# Patient Record
Sex: Female | Born: 1960 | Race: White | Hispanic: No | Marital: Married
Health system: Midwestern US, Community
[De-identification: ages and names within clinical notes are randomized; demographics above are authoritative.]

## PROBLEM LIST (undated history)

## (undated) DIAGNOSIS — E785 Hyperlipidemia, unspecified: Secondary | ICD-10-CM

## (undated) DIAGNOSIS — C801 Malignant (primary) neoplasm, unspecified: Secondary | ICD-10-CM

## (undated) HISTORY — PX: SPINAL FUSION: SHX223

## (undated) HISTORY — PX: HIATAL HERNIA REPAIR: SHX195

## (undated) HISTORY — PX: ABDOMINAL HYSTERECTOMY: SHX81

## (undated) HISTORY — PX: OTHER SURGICAL HISTORY: SHX169

## (undated) HISTORY — DX: Hyperlipidemia, unspecified: E78.5

---

## 2016-01-25 ENCOUNTER — Ambulatory Visit: Admit: 2016-01-25 | Discharge: 2016-01-25 | Payer: PRIVATE HEALTH INSURANCE | Attending: Urology

## 2016-01-25 DIAGNOSIS — N201 Calculus of ureter: Secondary | ICD-10-CM

## 2016-01-25 LAB — AMB POC URINALYSIS DIP STICK AUTO W/ MICRO (MICRO RESULTS)
Bilirubin (UA POC): NEGATIVE
Blood (UA POC): NEGATIVE
Crystals (UA POC): NEGATIVE
Epithelial cells (UA POC): 0
Glucose (UA POC): NEGATIVE
Ketones (UA POC): NEGATIVE
Nitrites (UA POC): NEGATIVE
Protein (UA POC): NEGATIVE
RBCs (UA POC): 0
Specific gravity (UA POC): 1.005 (ref 1.001–1.035)
Urobilinogen (UA POC): 0.2 (ref 0.2–1)
WBCs (UA POC): 0
pH (UA POC): 5.5 (ref 4.6–8.0)

## 2016-01-25 MED ORDER — TAMSULOSIN SR 0.4 MG 24 HR CAP
0.4 mg | ORAL_CAPSULE | Freq: Every evening | ORAL | 0 refills | Status: AC
Start: 2016-01-25 — End: 2016-02-08

## 2016-01-25 NOTE — Progress Notes (Signed)
HISTORY OF PRESENT ILLNESS:  Vanessa Atkinson is a 55 y.o. female who is seen in consultation for ?IC. On Lorazepam 1mg  PO. Pt states that she has been diagnosed w/ IC and given Vanessa Atkinson after nerve blocks were placed in her back. Pt is unsure if she truly has IC because symptoms have subsided since having back surgery. Pt had cysto and denies any pain w/ cysto filling. Pt had frequency Q 2 hours, nocturia x2, and severe chronic dysuria for approx 8 months prior to back surgery. Pt w/ hx of stones. Pt saw GI for moderate, aching abdominal pain and was sent for CT which showed left renal stone and small right ureteral stone. Pt has stopped Vanessa Atkinson and abdominal pain has subsided.    Past Medical History:   Diagnosis Date   ??? IC (interstitial cystitis)        Past Surgical History:   Procedure Laterality Date   ??? HX APPENDECTOMY     ??? HX CERVICAL FUSION     ??? HX CESAREAN SECTION      two   ??? HX HYSTERECTOMY         Social History   Substance Use Topics   ??? Smoking status: Never Smoker   ??? Smokeless tobacco: Never Used   ??? Alcohol use No       Allergies   Allergen Reactions   ??? Myrbetriq [Mirabegron] Anaphylaxis   ??? Penicillins Hives   ??? Percocet [Oxycodone-Acetaminophen] Nausea and Vomiting   ??? Sulfa (Sulfonamide Antibiotics) Hives       Family History   Problem Relation Age of Onset   ??? Prostate Cancer Father        Current Outpatient Prescriptions   Medication Sig Dispense Refill   ??? LORazepam (ATIVAN) 1 mg tablet      ??? simvastatin (ZOCOR) 10 mg tablet Take  by mouth nightly.     ??? tamsulosin (FLOMAX) 0.4 mg capsule Take 1 Cap by mouth nightly for 14 days. 14 Cap 0       Review of Systems  Constitutional: Fever: No  Skin: Rash: No  HEENT: Hearing difficulty: No  Eyes: Blurred vision: No  Cardiovascular: Chest pain: No  Respiratory: Shortness of breath: No  Gastrointestinal: Nausea/vomiting: No  Musculoskeletal: Back pain: No  Neurological: Weakness: No  Psychological: Memory loss: No   Comments/additional findings:       PHYSICAL EXAMINATION:     Visit Vitals   ??? BP 102/81   ??? Pulse 77   ??? Temp 97.5 ??F (36.4 ??C)   ??? Resp 16   ??? Ht 5\' 8"  (1.727 m)   ??? Wt 190 lb (86.2 kg)   ??? BMI 28.89 kg/m2     Constitutional: Well developed, no acute distress.   Eyes:  Conjunctiva normal.  Ears:  External ear normal.  Nose/Throat:  External nose normal.  CV:  Heart rate regular, No peripheral swelling noted.  Respiratory: No respiratory distress, No audible wheeze.  Abdomen:  Soft, nontender, nondistended, no mass/organomegaly.  Vascular:  Abdominal Aorta nonpalpable.  Skin: No rash, No ulcer.    Neuro/Psych:  Patient with appropriate affect.  Alert and oriented x 3.    Gait:  Normal.  GU Female:      CVA: non-tender bilaterally. Bladder not palpable.        REVIEW OF LABS AND IMAGING:      Results for orders placed or performed in visit on 01/25/16   AMB POC URINALYSIS DIP STICK AUTO  W/ MICRO (MICRO RESULTS)   Result Value Ref Range    Color (UA POC) Yellow     Clarity (UA POC) Clear     Glucose (UA POC) Negative Negative    Bilirubin (UA POC) Negative Negative    Ketones (UA POC) Negative Negative    Specific gravity (UA POC) 1.005 1.001 - 1.035    Blood (UA POC) Negative Negative    pH (UA POC) 5.5 4.6 - 8.0    Protein (UA POC) Negative Negative    Urobilinogen (UA POC) 0.2 mg/dL 0.2 - 1    Nitrites (UA POC) Negative Negative    Leukocyte esterase (UA POC) Trace Negative    Epithelial cells (UA POC) 0     WBCs (UA POC) 0      RBCs (UA POC) 0      Bacteria (UA POC) None Negative    Crystals (UA POC) Negative Negative    Other (UA POC)         ASSESSMENT:     ICD-10-CM ICD-9-CM    1. Ureteral calculus N20.1 592.1 AMB POC URINALYSIS DIP STICK AUTO W/ MICRO (MICRO RESULTS)   2. Kidney stone N20.0 592.0    3. Personal history of kidney stones Z87.442 V13.01          PLAN:    ?? Rx Flomax for 2 wks in case stone still present in ureter as cause of abd pain   ?? Increase water intake in case stone still present in ureter  ?? F/u 2 wks- if symptoms still present may need repeat ct  ?? D/c Vanessa Atkinson as doubt IC     Patient's BMI is out of the normal parameters.  Information about BMI was given to the patient.    Chief Complaint   Patient presents with   ??? Interstitial Cystitis     ?IC     Medical documentation provided with the assistance of Isac SarnaKimber N Moore, medical scribe for [employer] LifeChristi Jaylynne Birkhead, DO, [address]FACOS     Georganna Maxson [place], [place]

## 2016-01-30 NOTE — Progress Notes (Signed)
Likely contaminated as voided sample and skin flora.

## 2016-02-08 ENCOUNTER — Ambulatory Visit: Admit: 2016-02-08 | Discharge: 2016-02-08 | Payer: PRIVATE HEALTH INSURANCE | Attending: Urology

## 2016-02-08 DIAGNOSIS — N760 Acute vaginitis: Secondary | ICD-10-CM

## 2016-02-08 LAB — AMB POC URINALYSIS DIP STICK AUTO W/ MICRO (MICRO RESULTS)
Bilirubin (UA POC): NEGATIVE
Blood (UA POC): NEGATIVE
Crystals (UA POC): NEGATIVE
Epithelial cells (UA POC): 0
Glucose (UA POC): NEGATIVE
Ketones (UA POC): NEGATIVE
Leukocyte esterase (UA POC): NEGATIVE
Nitrites (UA POC): NEGATIVE
Protein (UA POC): NEGATIVE
RBCs (UA POC): 0
Specific gravity (UA POC): 1.005 (ref 1.001–1.035)
Urobilinogen (UA POC): 0.2 (ref 0.2–1)
WBCs (UA POC): 0
pH (UA POC): 5.5 (ref 4.6–8.0)

## 2016-02-08 NOTE — Progress Notes (Signed)
HISTORY OF PRESENT ILLNESS:  Vanessa Atkinson is a 55 y.o. female who presents today for f/u pt w/ c/o frequency, nocturia, dysuria, prior to a back surgery. Was diagnosed by [facility] w/ IC, but pt says all symptoms gone after back surgery. Had prior CT showing left renal and right ureteral stone and was having abdominal and flank pain. Last visit given Flomax and told to increase water intake.       Pt denies flank or abdominal pain, but notes moderate burning on external vulva. Pt reports that Flomax made her unable to hold her urine so stopped taking.    AUA Symptom Score 01/25/2016   Over the past month how often have you had the sensation that your bladder was not completely empty after you finished urinating? 1   Over the past month, how often have had to urinate again less than 2 hours after you last finished urinating? 0   Over the past month, how often have you found you stopped and started again several times when you urinated? 0   Over the past month, how often have you found it difficult to postpone urination? 1   Over the past month, how often have you had a weak urinary stream? 0   Over the past month, how often have you had to push or strain to begin urinating? 0   Over the past month, how many times did you most typically get up to urinate from the time you went to bed at night until the time you got up in the morning? 1   AUA Score 3   If you were to spend the rest of your life with your urinary condition the way it is now, how would you feel about that? Unhappy       Past Medical History:   Diagnosis Date   ??? IC (interstitial cystitis)        Past Surgical History:   Procedure Laterality Date   ??? HX APPENDECTOMY     ??? HX CERVICAL FUSION     ??? HX CESAREAN SECTION      two   ??? HX HYSTERECTOMY         Social History   Substance Use Topics   ??? Smoking status: Never Smoker   ??? Smokeless tobacco: Never Used   ??? Alcohol use No       Allergies   Allergen Reactions    ??? Myrbetriq [Mirabegron] Anaphylaxis   ??? Penicillins Hives   ??? Percocet [Oxycodone-Acetaminophen] Nausea and Vomiting   ??? Sulfa (Sulfonamide Antibiotics) Hives       Family History   Problem Relation Age of Onset   ??? Prostate Cancer Father        Current Outpatient Prescriptions   Medication Sig Dispense Refill   ??? LORazepam (ATIVAN) 1 mg tablet      ??? simvastatin (ZOCOR) 10 mg tablet Take  by mouth nightly.         REVIEW OF SYSTEMS:  Constitutional: Fever: No  Skin: Rash: No  HEENT: Hearing difficulty: No  Eyes: Blurred vision: No  Cardiovascular: Chest pain: No  Respiratory: Shortness of breath: No  Gastrointestinal: Nausea/vomiting: No  Musculoskeletal: Back pain: No  Neurological: Weakness: No  Psychological: Memory loss: No  Comments/additional findings:       PHYSICAL EXAMINATION:   Visit Vitals   ??? BP 107/74   ??? Pulse 74   ??? Temp 97.8 ??F (36.6 ??C)   ??? Resp 16   ???  Ht 5\' 8"  (1.727 m)   ??? Wt 190 lb (86.2 kg)   ??? BMI 28.89 kg/m2     Constitutional: Well developed, no acute distress.   Eyes:  Conjunctiva normal.  Ears:  External ear normal.  Nose/Throat:  External nose normal.  CV:  Heart rate regular, No peripheral swelling noted.  Respiratory: No respiratory distress, No audible wheeze.  Skin: No rash, No ulcer.    Neuro/Psych:  Patient with appropriate affect.  Alert and oriented x 3.    Gait:  Normal.  GU Female:        normal external female genitalia   normal urethral meatus with no lesions or prolapse   normal urethra with no mass   normal bladder with no fullness or tenderness   normal vagina with no mucosal lesions or prolapse   no vaginal discharge   Mild vaginal atrophy   Did Q-tip test and pt had discreet tenderness of vestibule      REVIEW OF LABS AND IMAGING:      Results for orders placed or performed in visit on 02/08/16   AMB POC URINALYSIS DIP STICK AUTO W/ MICRO (MICRO RESULTS)   Result Value Ref Range    Color (UA POC) Yellow     Clarity (UA POC) Clear     Glucose (UA POC) Negative Negative     Bilirubin (UA POC) Negative Negative    Ketones (UA POC) Negative Negative    Specific gravity (UA POC) 1.005 1.001 - 1.035    Blood (UA POC) Negative Negative    pH (UA POC) 5.5 4.6 - 8.0    Protein (UA POC) Negative Negative    Urobilinogen (UA POC) 0.2 mg/dL 0.2 - 1    Nitrites (UA POC) Negative Negative    Leukocyte esterase (UA POC) Negative Negative    Epithelial cells (UA POC) 0     WBCs (UA POC) 0      RBCs (UA POC) 0      Bacteria (UA POC) None Negative    Crystals (UA POC) Negative Negative    Other (UA POC)         ASSESSMENT:     ICD-10-CM ICD-9-CM    1. Vestibulitis of vagina N76.2 616.10    2. Vaginal atrophy N95.2 627.3    3. Personal history of kidney stones Z87.442 V13.01    4. Interstitial cystitis N30.10 595.1 AMB POC URINALYSIS DIP STICK AUTO W/ MICRO (MICRO RESULTS)         PLAN:    ?? Rx estrogen and testosterone cream to apply BID to vestibule  ?? F/u in approx 1 month     Patient's BMI is out of the normal parameters.  Information about BMI was given and patient was advised to follow-up with their PCP for further management.    Chief Complaint   Patient presents with   ??? Interstitial Cystitis     f/u ?IC     Medical documentation provided with the assistance of Isac SarnaKimber N Moore, medical scribe for [employer]Christi Ishaan Villamar, DO, [place].    Arush Gatliff [place], DO

## 2016-03-16 ENCOUNTER — Encounter: Attending: Urology

## 2016-03-26 ENCOUNTER — Encounter: Attending: Urology

## 2019-09-06 LAB — EXTERNAL GENERIC LAB PROCEDURE: COLOGUARD: NEGATIVE

## 2020-05-02 LAB — LIPID PANEL
Cholesterol: 214 — AB (ref 0–200)
LDL Cholesterol: 125

## 2020-05-02 LAB — COMPREHENSIVE METABOLIC PANEL
Calcium: 10.5 (ref 8.7–10.7)
GFR calc non Af Amer: 70

## 2020-05-02 LAB — BASIC METABOLIC PANEL
BUN: 13 (ref 4–21)
Creatinine: 0.8 (ref 0.5–1.1)

## 2020-05-04 LAB — BASIC METABOLIC PANEL
BUN: 21 (ref 4–21)
Creatinine: 0.7 (ref 0.5–1.1)

## 2020-05-04 LAB — COMPREHENSIVE METABOLIC PANEL
Calcium: 11.1 — AB (ref 8.7–10.7)
GFR calc non Af Amer: 83

## 2020-06-01 ENCOUNTER — Other Ambulatory Visit: Payer: Self-pay

## 2020-06-01 ENCOUNTER — Ambulatory Visit: Payer: BC Managed Care – PPO | Admitting: Nurse Practitioner

## 2020-06-01 ENCOUNTER — Encounter: Payer: Self-pay | Admitting: Nurse Practitioner

## 2020-06-01 NOTE — Progress Notes (Signed)
Endocrinology Consult Note        06/01/2020, 10:01 AM  Loretta Young is a 60 y.o.-year-old female, referred by her  Theone Murdoch, FNP  , for evaluation for hypercalcemia/hyperparathyroidism.   Past Medical History:  Diagnosis Date  . Hyperlipidemia     Past Surgical History:  Procedure Laterality Date  . ABDOMINAL HYSTERECTOMY    . CESAREAN SECTION    . HIATAL HERNIA REPAIR    . l4-l5 fusion      Social History   Tobacco Use  . Smoking status: Never Smoker  . Smokeless tobacco: Never Used    History reviewed. No pertinent family history.  Outpatient Encounter Medications as of 06/01/2020  Medication Sig  . Cholecalciferol 50 MCG (2000 UT) TABS Take by mouth.  . ezetimibe (ZETIA) 10 MG tablet Take 10 mg by mouth daily.  . Multiple Vitamin (MULTI-VITAMIN) tablet Take 1 tablet by mouth daily.  . NON FORMULARY Cs-e2 0.2/te 1 Nat Whip vaginal cream   No facility-administered encounter medications on file as of 06/01/2020.    Allergies  Allergen Reactions  . Penicillins   . Percocet [Oxycodone-Acetaminophen]   . Sulfa Antibiotics      HPI  Loretta Young was diagnosed with hypercalcemia in February, 2022.  Patient has no previously known history of parathyroid, pituitary, adrenal dysfunctions; + family history of hyperparathyroidism in her mother. -Review of her referral package of most recent labs reveals calcium of 11.1 the corresponding PTH of 142 on 05/20/20.  We discussed pt's DEXA scans: no report available to review but patient reports she is osteopenic, borderline osteoporotic.   No prior history of fragility fractures or falls. + history of kidney stones.  No history of CKD. Last BUN/Cr: 21/0.7  she is not on HCTZ or other thiazide therapy.  + history of vitamin D deficiency. Last vitamin D level was 38 in 05/02/20 (on replacement therapy).  she is on calcium supplements (recently started by her PCP after  her Dexa scan results showed osteopenia as she did not want to start Prolia at that time). she eats dairy and green, leafy, vegetables on average amounts.  She does not eat meat, therefore the majority of her meals consist of various salads.  she does not haveve a family history of pituitary tumors, thyroid cancer, or osteoporosis.   I reviewed her chart and she also has a history of HLD.    Review of systems  Constitutional: + steadily increasing body weight,  current Body mass index is 32.39 kg/m. , + fatigue, no subjective hyperthermia, no subjective hypothermia Eyes: no blurry vision, no xerophthalmia ENT: no sore throat, no nodules palpated in throat, no dysphagia/odynophagia, no hoarseness Cardiovascular: no chest pain, no shortness of breath, no palpitations, no leg swelling Respiratory: no cough, no shortness of breath Gastrointestinal: no nausea/vomiting/diarrhea Musculoskeletal: no muscle/joint aches Skin: no rashes, no hyperemia Neurological: no tremors, no numbness, no tingling, no dizziness Psychiatric: no depression, no anxiety  ----------------------------------------------------------------------------------------------------------------------------- OBJECTIVE:  BP 130/88 (BP Location: Left Arm, Patient Position: Sitting)   Pulse 69   Ht 5\' 8"  (1.727 m)   Wt 213 lb (96.6 kg)   BMI 32.39 kg/m , Body mass index is 32.39 kg/m.  Wt Readings from Last 3 Encounters:  06/01/20 213 lb (96.6 kg)    BP Readings from Last 3 Encounters:  06/01/20 130/88    Physical Exam- Limited  Constitutional:  Body mass index is 32.39 kg/m. , not in acute distress, normal state of mind Eyes:  EOMI, no exophthalmos Neck: Supple Cardiovascular: RRR, no murmers, rubs, or gallops, no edema Respiratory: Adequate breathing efforts, no crackles, rales, rhonchi, or wheezing Musculoskeletal: no gross deformities, strength intact in all four extremities, no gross restriction of joint  movements Skin:  no rashes, no hyperemia Neurological: no tremor with outstretched hands   CMP ( most recent) CMP     Component Value Date/Time   BUN 21 05/04/2020 0000   CREATININE 0.7 05/04/2020 0000   CALCIUM 11.1 (A) 05/04/2020 0000   GFRNONAA 83 05/04/2020 0000     Diabetic Labs (most recent): No results found for: HGBA1C   Lipid Panel ( most recent) Lipid Panel     Component Value Date/Time   CHOL 214 (A) 05/02/2020 0000   LDLCALC 125 05/02/2020 0000      No results found for: TSH, FREET4   ------------------------------------------------------------------------------------------------------------------------------ Assessment / PLAN:  1. 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 A corresponding intact PTH level was also high, at 142.  - Patient also  has vitamin D deficiency, with the last level being 38 (on Vitamin D supplementation).  - She does have apparent complications from hypercalcemia/hyperparathyroidism with nephrolithiasis and osteopenia.    -She is advised to stop all Calcium supplements.  Will need to revisit choice of osteopenia treatment once work complete for hyperparathyroidism as once it is corrected, bone loss will not continue and may not require further treatment.  - I discussed with the patient about the physiology of calcium and parathyroid hormone, and possible effects of increased PTH/ Calcium, including kidney stones, cardiac dysrhythmias, osteoporosis, abdominal pain, etc.   - The work up so far is not sufficient to reach a conclusion for definitive therapy.  she needs more studies to confirm and classify the parathyroid dysfunction she may have. -It is essential to obtain 24-hour urine calcium/creatinine to rule out the rare but important cause of mild elevation in calcium and PTH- [place] ( Familial Hypocalciuric  Hypercalcemia), which may not require any active intervention.  - I will request for her next DEXA scan to include the distal 33% of radius for evaluation of cortical bone, which is predominantly affected by hyperparathyroidism.  I have requested the patient obtain a copy of her recent bone density scan to be sent to our office.    - Time spent with the patient: 60 minutes, of which >50% was spent in obtaining information about her symptoms, reviewing her previous labs, evaluations, and treatments, counseling her about her  hypercalcemia , and developing a plan to confirm the diagnosis and long term treatment as necessary.  Please refer to "Patient Self Inventory" in the Media  tab for reviewed elements of pertinent patient history.  Lexine Baton participated in the discussions, expressed understanding, and voiced agreement with the above plans.  All questions were answered to her satisfaction. she is encouraged to contact clinic should she have any questions or concerns prior to her return visit.   Follow Up PLAN: - Return in about 2 weeks (around 06/15/2020) for hypoparathyroid- 24-hr urine.   Rayetta Pigg, FNP-BC [facility] [address] [place], [place]  [place] Phone: [phone] Fax: [phone]    06/01/2020, 10:00 AM

## 2020-06-01 NOTE — Patient Instructions (Signed)
Hypercalcemia Hypercalcemia is when the level of calcium in a person's blood is above normal. The body needs calcium to make bones and keep them strong. Calcium also helps the muscles, nerves, brain, and heart work the way they should. Most of the calcium in the body is in the bones. There is also some calcium in the blood. Hypercalcemia can happen when calcium comes out of the bones, or when the kidneys are not able to remove calcium from the blood. Hypercalcemia can be mild or severe. What are the causes? There are many possible causes of hypercalcemia. Common causes of this condition include:  Hyperparathyroidism. This is a condition in which the body produces too much parathyroid hormone. There are four parathyroid glands in your neck. These glands produce a chemical messenger (hormone) that helps the body absorb calcium from foods and helps your bones release calcium.  Certain kinds of cancer. Less common causes of hypercalcemia include:  Getting too much calcium or vitamin D from your diet.  Kidney failure.  Hyperthyroidism.  Severe dehydration.  Being on bed rest or being inactive for a long time.  Certain medicines.  Infections. What increases the risk? You are more likely to develop this condition if you:  Are female.  Are 60 years of age or older.  Have a family history of hypercalcemia. What are the signs or symptoms? Mild hypercalcemia that starts slowly may not cause symptoms. Severe, sudden hypercalcemia is more likely to cause symptoms, such as:  Being more thirsty than usual.  Needing to urinate more often than usual.  Abdominal pain.  Nausea and vomiting.  Constipation.  Muscle pain, twitching, or weakness.  Feeling very tired. How is this diagnosed? Hypercalcemia is usually diagnosed with a blood test. You may also have tests to help determine what is causing this condition, such as imaging tests and more blood tests.   How is this  treated? Treatment for hypercalcemia depends on the cause. Treatment may include:  Receiving fluids through an IV.  Medicines that: ? Keep calcium levels steady after receiving fluids (loop diuretics). ? Keep calcium in your bones (bisphosphonates). ? Lower the calcium level in your blood.  Surgery to remove overactive parathyroid glands.  A procedure that filters your blood to correct calcium levels (hemodialysis). Follow these instructions at home:  Take over-the-counter and prescription medicines only as told by your health care provider.  Follow instructions from your health care provider about eating or drinking restrictions.  Drink enough fluid to keep your urine pale yellow.  Stay active. Weight-bearing exercise helps to keep calcium in your bones. Follow instructions from your health care provider about what type and level of exercise is safe for you.  Keep all follow-up visits as told by your health care provider. This is important.   Contact a health care provider if you have:  A fever.  A heartbeat that is irregular or very fast.  Changes in mood, memory, or personality. Get help right away if you:  Have severe abdominal pain.  Have chest pain.  Have trouble breathing.  Become very confused and sleepy.  Lose consciousness. Summary  Hypercalcemia is when the level of calcium in a person's blood is above normal. The body needs calcium to make bones and keep them strong. Calcium also helps the muscles, nerves, brain, and heart work the way they should.  There are many possible causes of hypercalcemia, and treatment depends on the cause.  Take over-the-counter and prescription medicines only as told by your   health care provider.  Follow instructions from your health care provider about eating or drinking restrictions. This information is not intended to replace advice given to you by your health care provider. Make sure you discuss any questions you have with  your health care provider. Document Revised: 03/25/2018 Document Reviewed: 12/02/2017 Elsevier Patient Education  2021 [employer]  

## 2020-06-10 LAB — CREATININE, URINE, 24 HOUR
Creatinine, 24H Ur: 814 mg/24 hr (ref 800–1800)
Creatinine, Urine: 40.7 mg/dL

## 2020-06-10 LAB — CALCIUM, URINE, 24 HOUR
Calcium, 24H Urine: 226 mg/24 hr (ref 0–320)
Calcium, Urine: 11.3 mg/dL

## 2020-06-13 ENCOUNTER — Telehealth: Payer: Self-pay

## 2020-06-13 ENCOUNTER — Telehealth: Payer: BC Managed Care – PPO | Admitting: Nurse Practitioner

## 2020-06-14 ENCOUNTER — Other Ambulatory Visit: Payer: Self-pay

## 2020-06-14 ENCOUNTER — Telehealth (INDEPENDENT_AMBULATORY_CARE_PROVIDER_SITE_OTHER): Payer: BC Managed Care – PPO | Admitting: Nurse Practitioner

## 2020-06-14 ENCOUNTER — Encounter: Payer: Self-pay | Admitting: Nurse Practitioner

## 2020-06-14 DIAGNOSIS — E210 Primary hyperparathyroidism: Secondary | ICD-10-CM

## 2020-06-14 NOTE — Progress Notes (Signed)
Endocrinology Follow Up Note        06/14/2020, 8:31 AM    TELEHEALTH VISIT: The patient is being engaged in telehealth visit due to COVID-19.  This type of visit limits physical examination significantly, and thus is not preferable over face-to-face encounters.  I connected with  Loretta Young on 06/14/20 by a video enabled telemedicine application and verified that I am speaking with the correct person using two identifiers.   I discussed the limitations of evaluation and management by telemedicine. The patient expressed understanding and agreed to proceed.    The participants involved in this visit include: Loretta Romp, NP located at [facility] and Loretta Young  located at their personal residence listed.    Loretta Young is a 60 y.o.-year-old female, referred by her  Loretta Murdoch, FNP  , for evaluation for hypercalcemia/hyperparathyroidism.   Past Medical History:  Diagnosis Date  . Hyperlipidemia     Past Surgical History:  Procedure Laterality Date  . ABDOMINAL HYSTERECTOMY    . CESAREAN SECTION    . HIATAL HERNIA REPAIR    . l4-l5 fusion      Social History   Tobacco Use  . Smoking status: Never Smoker  . Smokeless tobacco: Never Used    History reviewed. No pertinent family history.  Outpatient Encounter Medications as of 06/14/2020  Medication Sig  . Cholecalciferol 50 MCG (2000 UT) TABS Take by mouth.  . ezetimibe (ZETIA) 10 MG tablet Take 10 mg by mouth daily.  . Multiple Vitamin (MULTI-VITAMIN) tablet Take 1 tablet by mouth daily.  . NON FORMULARY Cs-e2 0.2/te 1 Nat Whip vaginal cream   No facility-administered encounter medications on file as of 06/14/2020.    Allergies  Allergen Reactions  . Penicillins   . Percocet [Oxycodone-Acetaminophen]   . Sulfa Antibiotics      HPI  Loretta Young was diagnosed with hypercalcemia in February, 2022.  Patient has no previously  known history of parathyroid, pituitary, adrenal dysfunctions; + family history of hyperparathyroidism in her mother. -Review of her referral package of most recent labs reveals calcium of 11.1 the corresponding PTH of 142 on 05/20/20.  We discussed pt's DEXA scans: no report available to review but patient reports she is osteopenic, borderline osteoporotic.  Report from [facility] center shows T-score of -2.5 indicating osteoporosis.  No prior history of fragility fractures or falls. + history of kidney stones.  No history of CKD. Last BUN/Cr: 21/0.7  she is not on HCTZ or other thiazide therapy.  + history of vitamin D deficiency. Last vitamin D level was 38 in 05/02/20 (on replacement therapy).  she is not currently on calcium supplements (had recently been started on supplement by her PCP after her Dexa scan results showed osteopenia as she did not want to start Prolia at that time, however she was told to stop the supplementation at our initial visit). she eats dairy and green, leafy, vegetables on average amounts.  She does not eat meat, therefore the majority of her meals consist of various salads.  she does not haveve a family history of pituitary tumors, thyroid cancer, or osteoporosis.   I reviewed her chart and she also has  a history of HLD.    Review of systems  Constitutional: + steadily increasing body weight,  current There is no height or weight on file to calculate BMI. , + fatigue, no subjective hyperthermia, no subjective hypothermia Eyes: no blurry vision, no xerophthalmia ENT: no sore throat, no nodules palpated in throat, no dysphagia/odynophagia, no hoarseness Cardiovascular: no chest pain, no shortness of breath, no palpitations, no leg swelling Respiratory: no cough, no shortness of breath Gastrointestinal: no nausea/vomiting/diarrhea Musculoskeletal: no muscle/joint aches Skin: no rashes, no hyperemia Neurological: no tremors, no numbness, no tingling, no  dizziness Psychiatric: no depression, no anxiety, complains of difficulty concentrating  ----------------------------------------------------------------------------------------------------------------------------- OBJECTIVE:  There were no vitals taken for this visit., There is no height or weight on file to calculate BMI. Wt Readings from Last 3 Encounters:  06/01/20 213 lb (96.6 kg)    BP Readings from Last 3 Encounters:  06/01/20 130/88    Physical Exam- Telehealth- significantly limited due to nature of visit  Constitutional: There is no height or weight on file to calculate BMI. , not in acute distress, normal state of mind Respiratory: Adequate breathing efforts   CMP ( most recent) CMP     Component Value Date/Time   BUN 21 05/04/2020 0000   CREATININE 0.7 05/04/2020 0000   CALCIUM 11.1 (A) 05/04/2020 0000   GFRNONAA 83 05/04/2020 0000     Diabetic Labs (most recent): No results found for: HGBA1C   Lipid Panel ( most recent) Lipid Panel     Component Value Date/Time   CHOL 214 (A) 05/02/2020 0000   LDLCALC 125 05/02/2020 0000      No results found for: TSH, FREET4   ------------------------------------------------------------------------------------------------------------------------------ Assessment / PLAN:  1. Hypercalcemia / Hyperparathyroidism-  D/t primary hyperparathyroidism  Patient has had several instances of elevated calcium, with the highest level being at 11.1 mg/dL. A corresponding intact PTH level was also high, at 142.  - Patient also  has vitamin D deficiency, with the last level being 38 (on Vitamin D supplementation).  - She does have apparent complications from hypercalcemia/hyperparathyroidism with nephrolithiasis and osteopenia.    -She is advised to continue avoiding all Calcium supplements.  Will need to revisit choice of osteopenia treatment once work complete for hyperparathyroidism as once it is corrected, bone loss will not  continue and may not require further treatment.  - I discussed with the patient about the physiology of calcium and parathyroid hormone, and possible effects of increased PTH/ Calcium, including kidney stones, cardiac dysrhythmias, osteoporosis, abdominal pain, etc.   -Her 24-hr urine studies ruled out [place], thus confirming suspicion of primary hyperparathyroidism.  -She will be referred to Dr. Armandina Gemma with [facility] for parathyroidectomy of the overactive gland.  After which, she will return here in about 1 week postop to recheck PTH and calcium levels and also check TSH and Free T4 levels as well.     I spent 20 minutes dedicated to the care of this patient on the date of this encounter to include pre-visit review of records, face-to-face time with the patient, and post visit ordering of  testing.   Loretta Young participated in the discussions, expressed understanding, and voiced agreement with the above plans.  All questions were answered to her satisfaction. she is encouraged to contact clinic should she have any questions or concerns prior to her return visit.   Follow Up PLAN: - Return if symptoms worsen or fail to improve- after surgery to follow up with labs,  for Thyroid follow up, Previsit labs.   Rayetta Pigg, [facility] [address] [place], [place] [address] Phone: [phone] Fax: [phone]    06/14/2020, 8:31 AM

## 2020-06-14 NOTE — Patient Instructions (Signed)
Parathyroidectomy  A parathyroidectomy is a surgery to remove one or more parathyroid glands. These glands are in the neck. Each gland is very small, about the size of a pea. Most people have four parathyroid glands. The glands produce parathyroid hormone, which helps to control the level of calcium in the body. You may have a parathyroidectomy if your body produces too much parathyroid hormone (hyperparathyroidism). This usually occurs when one or more of your parathyroid glands becomes enlarged from a type of noncancerous tumor (adenoma). Tell a health care provider about:  Any allergies you have.  All medicines you are taking, including vitamins, herbs, eye drops, creams, and over-the-counter medicines.  Any problems you or family members have had with anesthetic medicines.  Any blood disorders you have.  Any surgeries you have had.  Any medical conditions you have.  Whether you are pregnant or may be pregnant. What are the risks? Generally, this is a safe procedure. However, problems may occur, including:  Bleeding.  Infection.  Allergic reactions to medicines.  Damage to the nerves of your voice box (larynx). This can be temporary or long-term (rare).  Damage to nearby structures and organs, such as the skin (scarring), surrounding blood vessels, and nerves in the neck.  Hoarseness. This usually resolves in 24-48 hours.  A condition in which your body does not make enough parathyroid hormone (hypoparathyroidism). This is rare.  Difficulty breathing. This is rare. What happens before the procedure? Staying hydrated Follow instructions from your health care provider about hydration, which may include:  Up to 2 hours before the procedure - you may continue to drink clear liquids, such as water, clear fruit juice, black coffee, and plain tea. Eating and drinking restrictions Follow instructions from your health care provider about eating and drinking, which may  include:  8 hours before the procedure - stop eating heavy meals or foods such as meat, fried foods, or fatty foods.  6 hours before the procedure - stop eating light meals or foods, such as toast or cereal.  6 hours before the procedure - stop drinking milk or drinks that contain milk.  2 hours before the procedure - stop drinking clear liquids. Medicines Ask your health care provider about:  Changing or stopping your regular medicines. This is especially important if you are taking diabetes medicines or blood thinners.  Taking medicines such as aspirin and ibuprofen. These medicines can thin your blood. Do not take these medicines unless your health care provider tells you to take them.  Taking over-the-counter medicines, vitamins, herbs, and supplements. General instructions  You may be asked to shower with a germ-killing soap.  Plan to have a responsible adult take you home from the hospital or clinic.  Plan to have a responsible adult care for you for at least 24 hours after you leave the hospital or clinic. This is important. What happens during the procedure?  To lower your risk of infection: ? Your health care team will wash or sanitize their hands. ? Hair may be removed from the surgical area. ? Your skin will be washed with soap.  An IV will be inserted into one of your veins.  You will be given one or more of the following: ? A medicine to help you relax (sedative). ? A medicine to make you fall asleep (general anesthetic).  An incision will be made according to the type of parathyroidectomy procedure you are having. There are four methods that may be used: ? Open surgery. A single  incision will be made in the center of your neck. The incision will be about 2-4 inches long. ? Minimally invasive surgery. A small incision will be made in the side of your neck. This incision will be about 1-2 inches long. Before the procedure, you might be given an injection of a type  of medicine that will help the surgeon to locate the gland. ? Video-assisted surgery. Two small incisions will be made in your neck. One incision is for the instruments that will be used to remove the gland. The other incision is for a tiny camera that will help the surgeon to see inside your neck. ? Endoscopic surgery. An incision will be made just above your collarbone. A small, flexible tube (endoscope) will be inserted through this incision.  Your health care provider may monitor laryngeal nerve function during the procedure for safety reasons.  The gland or glands that are causing problems will be removed.  The incisions will be closed using stitches (sutures) or other methods. The sutures will often be hidden under the skin. The procedure may vary among health care providers and hospitals. What happens after the procedure?  Your blood pressure, heart rate, breathing rate, and blood oxygen level will be monitored until the medicines you were given have worn off.  You will be given pain medicine as needed.  Your provider will check your ability to talk and swallow after the procedure.  You will gradually start to drink liquids and have soft foods as tolerated.  Your blood will be tested to check the calcium level in your body.  Do not drive for 24 hours if you were given a sedative during your procedure. Summary  The parathyroid glands are located in the neck and produce parathyroid hormone, which helps to control the level of calcium in the body.  A parathyroidectomy is a surgery to remove one or more parathyroid glands.  You may have a parathyroidectomy if your body produces too much parathyroid hormone (hyperparathyroidism).  There are four surgical methods that may be used for a parathyroidectomy: open, minimally invasive, video-assisted, and endoscopic.  Generally, this is a safe procedure. However, problems may occur, including bleeding, infection, and a hoarse or weak  voice. This information is not intended to replace advice given to you by your health care provider. Make sure you discuss any questions you have with your health care provider. Document Revised: 11/12/2019 Document Reviewed: 11/12/2019 Elsevier Patient Education  2021 [employer].

## 2020-06-16 ENCOUNTER — Telehealth: Payer: BC Managed Care – PPO | Admitting: Nurse Practitioner

## 2020-06-22 ENCOUNTER — Telehealth: Payer: Self-pay | Admitting: Nurse Practitioner

## 2020-06-22 MED ORDER — FUROSEMIDE 20 MG PO TABS: 20.0000 mg | ORAL_TABLET | Freq: Every day | ORAL | 0 refills | Status: DC | PRN

## 2020-06-22 NOTE — Telephone Encounter (Signed)
Discussed with pt, understanding voiced. 

## 2020-06-22 NOTE — Telephone Encounter (Signed)
Pt is calling back. Please advise thank you

## 2020-06-22 NOTE — Telephone Encounter (Signed)
Pt is calling and states her thyroid surgery is not until May 10th but she is retaining a lot of fluid and wants to know if something can be done for this in the meantime.

## 2020-06-22 NOTE — Telephone Encounter (Signed)
Please advise 

## 2020-07-20 NOTE — Telephone Encounter (Signed)
error 

## 2020-07-22 ENCOUNTER — Other Ambulatory Visit: Payer: Self-pay | Admitting: Surgery

## 2020-07-22 ENCOUNTER — Other Ambulatory Visit (HOSPITAL_COMMUNITY): Payer: Self-pay | Admitting: Surgery

## 2020-07-22 DIAGNOSIS — E213 Hyperparathyroidism, unspecified: Secondary | ICD-10-CM

## 2020-08-03 ENCOUNTER — Encounter (HOSPITAL_COMMUNITY)
Admission: RE | Admit: 2020-08-03 | Discharge: 2020-08-03 | Disposition: A | Discharge status: Home / Self Care | Payer: BC Managed Care – PPO | Source: Ambulatory Visit | Attending: Surgery | Admitting: Surgery

## 2020-08-03 ENCOUNTER — Ambulatory Visit (HOSPITAL_COMMUNITY)
Admission: RE | Admit: 2020-08-03 | Discharge: 2020-08-03 | Disposition: A | Discharge status: Home / Self Care | Payer: BC Managed Care – PPO | Source: Ambulatory Visit | Attending: Surgery | Admitting: Surgery

## 2020-08-03 DIAGNOSIS — E213 Hyperparathyroidism, unspecified: Secondary | ICD-10-CM | POA: Insufficient documentation

## 2020-08-03 MED ORDER — TECHNETIUM TC 99M SESTAMIBI GENERIC - CARDIOLITE
25.0000 | Freq: Once | INTRAVENOUS | Status: AC | PRN
  Administered 2020-08-03: 27 via INTRAVENOUS

## 2020-08-17 ENCOUNTER — Ambulatory Visit: Payer: Self-pay | Admitting: Surgery

## 2020-08-17 ENCOUNTER — Telehealth: Payer: Self-pay | Admitting: Nurse Practitioner

## 2020-08-17 NOTE — Telephone Encounter (Signed)
Pt is calling and states that she seen [place] in March, and was referred to a surgeon but she has yet to hear anything from the surgeon office and she is wondering if we can call and move the process along faster/ [phone]

## 2020-08-17 NOTE — Telephone Encounter (Signed)
Talked with Maudie Mercury for [facility], she stated a surgical order had been submitted this morning and pt should here from surgical scheduler within a few days. Called pt to make her aware, she stated she had just been contacted by Dr.Gerkin.

## 2020-09-01 NOTE — Patient Instructions (Signed)
DUE TO COVID-19 ONLY ONE VISITOR IS ALLOWED TO COME WITH YOU AND STAY IN THE WAITING ROOM ONLY DURING PRE OP AND PROCEDURE DAY OF SURGERY. THE 1 VISITOR  MAY VISIT WITH YOU AFTER SURGERY IN YOUR PRIVATE ROOM DURING VISITING HOURS ONLY!               Loretta Young   Your procedure is scheduled on: 09/05/20   Report to [facility] Main  Entrance   Report to admitting at : 10:00 AM     Call this number if you have problems the morning of surgery [phone]    Remember: Do not eat solid food :After Midnight. Clear liquids until: 9:00 am.  CLEAR LIQUID DIET  Foods Allowed                                                                     Foods Excluded  Coffee and tea, regular and decaf                             liquids that you cannot  Plain Jell-O any favor except red or purple                                           see through such as: Fruit ices (not with fruit pulp)                                     milk, soups, orange juice  Iced Popsicles                                    All solid food Carbonated beverages, regular and diet                                    Cranberry, grape and apple juices Sports drinks like Gatorade Lightly seasoned clear broth or consume(fat free) Sugar, honey syrup  Sample Menu Breakfast                                Lunch                                     Supper Cranberry juice                    Beef broth                            Chicken broth Jell-O                                     Grape  juice                           Apple juice Coffee or tea                        Jell-O                                      Popsicle                                                Coffee or tea                        Coffee or tea  _____________________________________________________________________   BRUSH YOUR TEETH MORNING OF SURGERY AND RINSE YOUR MOUTH OUT, NO CHEWING GUM CANDY OR MINTS.    Take these medicines the morning of  surgery with A SIP OF WATER: omeprazole.                               You may not have any metal on your body including hair pins and              piercings  Do not wear jewelry, make-up, lotions, powders or perfumes, deodorant             Do not wear nail polish on your fingernails.  Do not shave  48 hours prior to surgery.    Do not bring valuables to the hospital. [place].  Contacts, dentures or bridgework may not be worn into surgery.  Leave suitcase in the car. After surgery it may be brought to your room.     Patients discharged the day of surgery will not be allowed to drive home. IF YOU ARE HAVING SURGERY AND GOING HOME THE SAME DAY, YOU MUST HAVE AN ADULT TO DRIVE YOU HOME AND BE WITH YOU FOR 24 HOURS. YOU MAY GO HOME BY TAXI OR UBER OR ORTHERWISE, BUT AN ADULT MUST ACCOMPANY YOU HOME AND STAY WITH YOU FOR 24 HOURS.  Name and phone number of your driver:  Special Instructions: N/A              Please read over the following fact sheets you were given: _____________________________________________________________________           [facility] - Preparing for Surgery Before surgery, you can play an important role.  Because skin is not sterile, your skin needs to be as free of germs as possible.  You can reduce the number of germs on your skin by washing with CHG (chlorahexidine gluconate) soap before surgery.  CHG is an antiseptic cleaner which kills germs and bonds with the skin to continue killing germs even after washing. Please DO NOT use if you have an allergy to CHG or antibacterial soaps.  If your skin becomes reddened/irritated stop using the CHG and inform your nurse when you arrive at Short Stay. Do not shave (including legs and underarms) for at least 48 hours prior to the first CHG  shower.  You may shave your face/neck. Please follow these instructions carefully:  1.  Shower with CHG Soap the night before surgery and the   morning of Surgery.  2.  If you choose to wash your hair, wash your hair first as usual with your  normal  shampoo.  3.  After you shampoo, rinse your hair and body thoroughly to remove the  shampoo.                           4.  Use CHG as you would any other liquid soap.  You can apply chg directly  to the skin and wash                       Gently with a scrungie or clean washcloth.  5.  Apply the CHG Soap to your body ONLY FROM THE NECK DOWN.   Do not use on face/ open                           Wound or open sores. Avoid contact with eyes, ears mouth and genitals (private parts).                       Wash face,  Genitals (private parts) with your normal soap.             6.  Wash thoroughly, paying special attention to the area where your surgery  will be performed.  7.  Thoroughly rinse your body with warm water from the neck down.  8.  DO NOT shower/wash with your normal soap after using and rinsing off  the CHG Soap.                9.  Pat yourself dry with a clean towel.            10.  Wear clean pajamas.            11.  Place clean sheets on your bed the night of your first shower and do not  sleep with pets. Day of Surgery : Do not apply any lotions/deodorants the morning of surgery.  Please wear clean clothes to the hospital/surgery center.  FAILURE TO FOLLOW THESE INSTRUCTIONS MAY RESULT IN THE CANCELLATION OF YOUR SURGERY PATIENT SIGNATURE_________________________________  NURSE SIGNATURE__________________________________  ________________________________________________________________________

## 2020-09-02 ENCOUNTER — Other Ambulatory Visit: Payer: Self-pay

## 2020-09-02 ENCOUNTER — Encounter (HOSPITAL_COMMUNITY)
Admission: RE | Admit: 2020-09-02 | Discharge: 2020-09-02 | Disposition: A | Discharge status: Home / Self Care | Payer: BC Managed Care – PPO | Source: Ambulatory Visit | Attending: Surgery | Admitting: Surgery

## 2020-09-02 ENCOUNTER — Encounter (HOSPITAL_COMMUNITY): Payer: Self-pay

## 2020-09-02 DIAGNOSIS — Z886 Allergy status to analgesic agent status: Secondary | ICD-10-CM | POA: Diagnosis not present

## 2020-09-02 DIAGNOSIS — Z885 Allergy status to narcotic agent status: Secondary | ICD-10-CM | POA: Diagnosis not present

## 2020-09-02 DIAGNOSIS — D351 Benign neoplasm of parathyroid gland: Secondary | ICD-10-CM | POA: Diagnosis not present

## 2020-09-02 DIAGNOSIS — Z01812 Encounter for preprocedural laboratory examination: Secondary | ICD-10-CM | POA: Insufficient documentation

## 2020-09-02 DIAGNOSIS — Z87891 Personal history of nicotine dependence: Secondary | ICD-10-CM | POA: Diagnosis not present

## 2020-09-02 DIAGNOSIS — Z9049 Acquired absence of other specified parts of digestive tract: Secondary | ICD-10-CM | POA: Diagnosis not present

## 2020-09-02 DIAGNOSIS — Z882 Allergy status to sulfonamides status: Secondary | ICD-10-CM | POA: Diagnosis not present

## 2020-09-02 DIAGNOSIS — Z90711 Acquired absence of uterus with remaining cervical stump: Secondary | ICD-10-CM | POA: Diagnosis not present

## 2020-09-02 DIAGNOSIS — E210 Primary hyperparathyroidism: Secondary | ICD-10-CM | POA: Diagnosis present

## 2020-09-02 DIAGNOSIS — Z880 Allergy status to penicillin: Secondary | ICD-10-CM | POA: Diagnosis not present

## 2020-09-02 HISTORY — DX: Malignant (primary) neoplasm, unspecified: C80.1

## 2020-09-02 LAB — CBC
HCT: 42.2 % (ref 36.0–46.0)
Hemoglobin: 13.8 g/dL (ref 12.0–15.0)
MCH: 30.1 pg (ref 26.0–34.0)
MCHC: 32.7 g/dL (ref 30.0–36.0)
MCV: 91.9 fL (ref 80.0–100.0)
Platelets: 286 10*3/uL (ref 150–400)
RBC: 4.59 MIL/uL (ref 3.87–5.11)
RDW: 13.2 % (ref 11.5–15.5)
WBC: 5.9 10*3/uL (ref 4.0–10.5)
nRBC: 0 % (ref 0.0–0.2)

## 2020-09-02 NOTE — Progress Notes (Signed)
COVID Vaccine Completed: Yes Date COVID Vaccine completed: 06/2019 COVID vaccine manufacturer:  [place]     PCP - Theone Murdoch: FNP Cardiologist -   Chest x-ray -  EKG -  Stress Test -  ECHO -  Cardiac Cath -  Pacemaker/ICD device last checked:  Sleep Study -  CPAP -   Fasting Blood Sugar -  Checks Blood Sugar _____ times a day  Blood Thinner Instructions: Aspirin Instructions: Last Dose:  Anesthesia review:   Patient denies shortness of breath, fever, cough and chest pain at PAT appointment   Patient verbalized understanding of instructions that were given to them at the PAT appointment. Patient was also instructed that they will need to review over the PAT instructions again at home before surgery.

## 2020-09-04 ENCOUNTER — Encounter (HOSPITAL_COMMUNITY): Payer: Self-pay | Admitting: Surgery

## 2020-09-04 DIAGNOSIS — E210 Primary hyperparathyroidism: Secondary | ICD-10-CM | POA: Diagnosis present

## 2020-09-04 NOTE — H&P (Signed)
General Surgery [facility]  Lexine Baton DOB: [birth year] Married / Language: English / Race: White Female   History of Present Illness   The patient is a 60 year old female who presents with primary hyperparathyroidism.  CHIEF COMPLAINT: primary hyperparathyroidism  Patient is referred by Rayetta Pigg, FNP, for surgical evaluation and recommendations regarding primary hyperparathyroidism.  Patient was recently noted on routine laboratory studies to have elevated serum calcium levels.  She was referred to endocrinology for further evaluation.  Levels in March 2022 included a calcium level of 11.1 and an intact PTH level of 142.  Vitamin D level was 38.  24-hour urine collection for calcium was normal at 226.  Patient had developed symptoms including chronic fatigue, a history of nephrolithiasis, and a recent bone density scan showing evidence of osteoporosis.  Patient has had no imaging studies on the neck.  She has had no prior head or neck surgery.  There is a family history of hypothyroidism and the patient's mother but no family members with parathyroid neoplasms.  Patient now presents to discuss primary hyperparathyroidism and potentially surgery.  Patient lives in [place].  She has been seen by physicians here in [place] in the past for other issues.   Past Surgical History  Appendectomy   Hysterectomy (not due to cancer) - Partial   Laparoscopic Inguinal Hernia Surgery   Bilateral. Spinal Surgery - Lower Back    Diagnostic Studies History Colonoscopy   >10 years ago Mammogram   within last year Pap Smear   1-5 years ago  Allergies   Sulfa 10 *OPHTHALMIC AGENTS*   Percocet *ANALGESICS - OPIOID*   Percogesic *COUGH/COLD/ALLERGY*   Pennyroyal Oil *CHEMICALS*   Penicillin G Pot in Dextrose *PENICILLINS*    Medication History  Ezetimibe  (10MG  Tablet, Oral) Active.  Social History  Alcohol use   Remotely quit alcohol use. No caffeine use   No  drug use   Tobacco use   Former smoker.  Family History Alcohol Abuse   Brother. Anesthetic complications   Brother. Arthritis   Father. Depression   Daughter. Hypertension   Father. Kidney Disease   Father. Melanoma   Brother, Father. Prostate Cancer   Father. Thyroid problems   Father, Mother.  Pregnancy / Birth History  Age at menarche   19 years. Age of menopause   69-55 Gravida   3 Irregular periods   Maternal age   73-30 Para   2  Other Problems  Back Pain   Cerebrovascular Accident   Gastroesophageal Reflux Disease   Hemorrhoids   Hypercholesterolemia   Inguinal Hernia   Kidney Stone   Melanoma   Thyroid Disease    Review of Systems  General Present- Fatigue and Weight Gain. Not Present- Appetite Loss, Chills, Fever, Night Sweats and Weight Loss. Breast Not Present- Breast Mass, Breast Pain, Nipple Discharge and Skin Changes. Cardiovascular Present- Shortness of Breath. Not Present- Chest Pain, Difficulty Breathing Lying Down, Leg Cramps, Palpitations, Rapid Heart Rate and Swelling of Extremities. Gastrointestinal Present- Change in Bowel Habits. Not Present- Abdominal Pain, Bloating, Bloody Stool, Chronic diarrhea, Constipation, Difficulty Swallowing, Excessive gas, Gets full quickly at meals, Hemorrhoids, Indigestion, Nausea, Rectal Pain and Vomiting. Female Genitourinary Not Present- Frequency, Nocturia, Painful Urination, Pelvic Pain and Urgency. Musculoskeletal Present- Swelling of Extremities. Not Present- Back Pain, Joint Pain, Joint Stiffness, Muscle Pain and Muscle Weakness. Neurological Present- Decreased Memory. Not Present- Fainting, Headaches, Numbness, Seizures, Tingling, Tremor, Trouble walking and Weakness. Psychiatric Not Present- Anxiety, Bipolar,  Change in Sleep Pattern, Depression, Fearful and Frequent crying. Endocrine Not Present- Cold Intolerance, Excessive Hunger, Hair Changes, Heat Intolerance, Hot flashes and New Diabetes. Hematology Not  Present- Blood Thinners, Easy Bruising, Excessive bleeding, Gland problems, HIV and Persistent Infections.  Vitals  Weight: 215.38 lb   Height: 68 in  Body Surface Area: 2.11 m   Body Mass Index: 32.75 kg/m   Temp.: 98.1 F    Pulse: 100 (Regular)    P.OX: 99% (Room air) BP: 126/82(Sitting, Left Arm, Standard)  Physical Exam   GENERAL APPEARANCE Development: normal Nutritional status: normal Gross deformities: none  SKIN Rash, lesions, ulcers: none Induration, erythema: none Nodules: none palpable  EYES Conjunctiva and lids: normal Pupils: equal and reactive Iris: normal bilaterally  EARS, NOSE, MOUTH, THROAT External ears: no lesion or deformity External nose: no lesion or deformity Hearing: grossly normal Due to Covid-19 pandemic, patient is wearing a mask.  NECK Symmetric: yes Trachea: midline Thyroid: no palpable nodules in the thyroid bed  CHEST Respiratory effort: normal Retraction or accessory muscle use: no Breath sounds: normal bilaterally Rales, rhonchi, wheeze: none  CARDIOVASCULAR Auscultation: regular rhythm, normal rate Murmurs: none Pulses: radial pulse 2+ palpable Lower extremity edema: none  MUSCULOSKELETAL Station and gait: normal Digits and nails: no clubbing or cyanosis Muscle strength: grossly normal all extremities Range of motion: grossly normal all extremities Deformity: none  LYMPHATIC Cervical: none palpable Supraclavicular: none palpable  PSYCHIATRIC Oriented to person, place, and time: yes Mood and affect: normal for situation Judgment and insight: appropriate for situation    Assessment & Plan  PRIMARY HYPERPARATHYROIDISM (E21.0)  Follow Up - Call [facility] office after tests / studies done to discuss further plans  Patient is referred by her endocrinologist for surgical evaluation and recommendations regarding primary hyperparathyroidism.   Patient provided with a copy of "Parathyroid Surgery: Treatment for Your  Parathyroid Gland Problem", published by [employer], 12 pages.  Book reviewed and explained to patient during visit today.  Patient has biochemical evidence of primary hyperparathyroidism with complications including chronic fatigue, osteoporosis, and a history of nephrolithiasis.  I would like to obtain imaging studies to include an ultrasound examination of the neck as well as a nuclear medicine parathyroid scan.  We will make arrangements for these studies to be performed at [facility] in [place], [place].  The study should be able to be performed at the same setting.  We discussed these studies.  They are about 80% successful in localizing the parathyroid adenoma.  If they are successful, then the patient will be a good candidate for minimally invasive outpatient surgery.  We discussed the procedure.  We discussed the size and location of the surgical incision.  We discussed the timing of surgery.  If these studies failed to localize an adenoma, then I will recommend proceeding with a 4D CT scan of the neck in hopes of localizing the parathyroid adenoma and making minimally invasive surgery possible.  Patient will undergo the above studies.  We will contact her with results and make further plans at that time.  ADDENDUM  Telephone call to patient this morning with results of her nuclear medicine parathyroid scan from Aug 03, 2020 and her thyroid ultrasound from the same date.  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  We will plan on proceeding with minimally invasive parathyroidectomy.  Patient is also noted to have multiple thyroid nodules for which follow-up ultrasound is recommended in  one year.  We will make arrangements for follow-up with our practice in 1 year with a repeat ultrasound and TSH level followed by physical examination.  I discussed this with the patient today.  Orders will be submitted for  surgery.  The risks and benefits of the procedure have been discussed at length with the patient.  The patient understands the proposed procedure, potential alternative treatments, and the course of recovery to be expected.  All of the patient's questions have been answered at this time.  The patient wishes to proceed with surgery.  Armandina Gemma, [facility] Office: [phone]

## 2020-09-05 ENCOUNTER — Encounter (HOSPITAL_COMMUNITY)
Admission: RE | Disposition: A | Discharge status: Home / Self Care | Payer: Self-pay | Source: Home / Self Care | Attending: Surgery

## 2020-09-05 ENCOUNTER — Encounter (HOSPITAL_COMMUNITY): Payer: Self-pay | Admitting: Surgery

## 2020-09-05 ENCOUNTER — Ambulatory Visit (HOSPITAL_COMMUNITY): Payer: BC Managed Care – PPO | Admitting: Registered Nurse

## 2020-09-05 ENCOUNTER — Ambulatory Visit (HOSPITAL_COMMUNITY)
Admission: RE | Admit: 2020-09-05 | Discharge: 2020-09-05 | Disposition: A | Discharge status: Home / Self Care | Payer: BC Managed Care – PPO | Attending: Surgery | Admitting: Surgery

## 2020-09-05 DIAGNOSIS — Z880 Allergy status to penicillin: Secondary | ICD-10-CM | POA: Insufficient documentation

## 2020-09-05 DIAGNOSIS — Z886 Allergy status to analgesic agent status: Secondary | ICD-10-CM | POA: Insufficient documentation

## 2020-09-05 DIAGNOSIS — E210 Primary hyperparathyroidism: Secondary | ICD-10-CM | POA: Diagnosis present

## 2020-09-05 DIAGNOSIS — D351 Benign neoplasm of parathyroid gland: Secondary | ICD-10-CM | POA: Insufficient documentation

## 2020-09-05 DIAGNOSIS — Z87891 Personal history of nicotine dependence: Secondary | ICD-10-CM | POA: Insufficient documentation

## 2020-09-05 DIAGNOSIS — Z882 Allergy status to sulfonamides status: Secondary | ICD-10-CM | POA: Insufficient documentation

## 2020-09-05 DIAGNOSIS — Z90711 Acquired absence of uterus with remaining cervical stump: Secondary | ICD-10-CM | POA: Insufficient documentation

## 2020-09-05 DIAGNOSIS — Z9049 Acquired absence of other specified parts of digestive tract: Secondary | ICD-10-CM | POA: Insufficient documentation

## 2020-09-05 DIAGNOSIS — Z885 Allergy status to narcotic agent status: Secondary | ICD-10-CM | POA: Insufficient documentation

## 2020-09-05 HISTORY — PX: PARATHYROIDECTOMY: SHX19

## 2020-09-05 SURGERY — PARATHYROIDECTOMY
Anesthesia: General | Site: Neck | Laterality: Left

## 2020-09-05 MED ORDER — LACTATED RINGERS IV SOLN: INTRAVENOUS | Status: DC

## 2020-09-05 MED ORDER — DEXAMETHASONE SODIUM PHOSPHATE 10 MG/ML IJ SOLN
INTRAMUSCULAR | Status: DC | PRN
  Administered 2020-09-05: 10 mg via INTRAVENOUS

## 2020-09-05 MED ORDER — HEMOSTATIC AGENTS (NO CHARGE) OPTIME
TOPICAL | Status: DC | PRN
  Administered 2020-09-05: 1 via TOPICAL

## 2020-09-05 MED ORDER — MIDAZOLAM HCL 2 MG/2ML IJ SOLN
INTRAMUSCULAR | Status: AC
  Filled 2020-09-05: qty 2

## 2020-09-05 MED ORDER — PROPOFOL 10 MG/ML IV BOLUS
INTRAVENOUS | Status: AC
  Filled 2020-09-05: qty 20

## 2020-09-05 MED ORDER — 0.9 % SODIUM CHLORIDE (POUR BTL) OPTIME
TOPICAL | Status: DC | PRN
  Administered 2020-09-05: 1000 mL

## 2020-09-05 MED ORDER — CHLORHEXIDINE GLUCONATE CLOTH 2 % EX PADS: 6.0000 | MEDICATED_PAD | Freq: Once | CUTANEOUS | Status: DC

## 2020-09-05 MED ORDER — CIPROFLOXACIN IN D5W 400 MG/200ML IV SOLN
400.0000 mg | INTRAVENOUS | Status: AC
  Administered 2020-09-05: 400 mg via INTRAVENOUS

## 2020-09-05 MED ORDER — FENTANYL CITRATE (PF) 100 MCG/2ML IJ SOLN
INTRAMUSCULAR | Status: AC
  Filled 2020-09-05: qty 2

## 2020-09-05 MED ORDER — SCOPOLAMINE 1 MG/3DAYS TD PT72
1.0000 | MEDICATED_PATCH | TRANSDERMAL | Status: DC
  Administered 2020-09-05: 1.5 mg via TRANSDERMAL
  Filled 2020-09-05: qty 1

## 2020-09-05 MED ORDER — ROCURONIUM BROMIDE 10 MG/ML (PF) SYRINGE
PREFILLED_SYRINGE | INTRAVENOUS | Status: DC | PRN
  Administered 2020-09-05: 40 mg via INTRAVENOUS

## 2020-09-05 MED ORDER — LIDOCAINE 2% (20 MG/ML) 5 ML SYRINGE
INTRAMUSCULAR | Status: AC
  Filled 2020-09-05: qty 5

## 2020-09-05 MED ORDER — ONDANSETRON HCL 4 MG/2ML IJ SOLN
INTRAMUSCULAR | Status: DC | PRN
  Administered 2020-09-05: 4 mg via INTRAVENOUS

## 2020-09-05 MED ORDER — LIDOCAINE 2% (20 MG/ML) 5 ML SYRINGE
INTRAMUSCULAR | Status: DC | PRN
  Administered 2020-09-05: 80 mg via INTRAVENOUS

## 2020-09-05 MED ORDER — HYDRALAZINE HCL 20 MG/ML IJ SOLN: 5.0000 mg | INTRAMUSCULAR | Status: DC | PRN

## 2020-09-05 MED ORDER — ROCURONIUM BROMIDE 10 MG/ML (PF) SYRINGE
PREFILLED_SYRINGE | INTRAVENOUS | Status: AC
  Filled 2020-09-05: qty 10

## 2020-09-05 MED ORDER — ORAL CARE MOUTH RINSE: 15.0000 mL | Freq: Once | OROMUCOSAL | Status: AC

## 2020-09-05 MED ORDER — HYDROMORPHONE HCL 1 MG/ML IJ SOLN: 0.2500 mg | INTRAMUSCULAR | Status: DC | PRN

## 2020-09-05 MED ORDER — FENTANYL CITRATE (PF) 100 MCG/2ML IJ SOLN
INTRAMUSCULAR | Status: DC | PRN
  Administered 2020-09-05: 50 ug via INTRAVENOUS
  Administered 2020-09-05: 25 ug via INTRAVENOUS
  Administered 2020-09-05: 50 ug via INTRAVENOUS

## 2020-09-05 MED ORDER — TRAMADOL HCL 50 MG PO TABS: 50.0000 mg | ORAL_TABLET | Freq: Once | ORAL | Status: DC

## 2020-09-05 MED ORDER — ACETAMINOPHEN 500 MG PO TABS
1000.0000 mg | ORAL_TABLET | Freq: Once | ORAL | Status: AC
  Administered 2020-09-05: 1000 mg via ORAL
  Filled 2020-09-05: qty 2

## 2020-09-05 MED ORDER — HYDROCODONE-ACETAMINOPHEN 7.5-325 MG PO TABS
1.0000 | ORAL_TABLET | Freq: Once | ORAL | Status: DC | PRN
Start: 2020-09-05 — End: 2020-09-05

## 2020-09-05 MED ORDER — SUGAMMADEX SODIUM 200 MG/2ML IV SOLN
INTRAVENOUS | Status: DC | PRN
  Administered 2020-09-05: 200 mg via INTRAVENOUS

## 2020-09-05 MED ORDER — BUPIVACAINE HCL 0.25 % IJ SOLN
INTRAMUSCULAR | Status: DC | PRN
  Administered 2020-09-05: 8 mL

## 2020-09-05 MED ORDER — MIDAZOLAM HCL 5 MG/5ML IJ SOLN
INTRAMUSCULAR | Status: DC | PRN
  Administered 2020-09-05: 2 mg via INTRAVENOUS

## 2020-09-05 MED ORDER — TRAMADOL HCL 50 MG PO TABS: 50.0000 mg | ORAL_TABLET | Freq: Four times a day (QID) | ORAL | 0 refills | Status: DC | PRN

## 2020-09-05 MED ORDER — ONDANSETRON HCL 4 MG/2ML IJ SOLN
INTRAMUSCULAR | Status: AC
  Filled 2020-09-05: qty 2

## 2020-09-05 MED ORDER — DEXAMETHASONE SODIUM PHOSPHATE 10 MG/ML IJ SOLN
INTRAMUSCULAR | Status: AC
  Filled 2020-09-05: qty 1

## 2020-09-05 MED ORDER — PROPOFOL 10 MG/ML IV BOLUS
INTRAVENOUS | Status: DC | PRN
  Administered 2020-09-05: 200 mg via INTRAVENOUS

## 2020-09-05 MED ORDER — CIPROFLOXACIN IN D5W 400 MG/200ML IV SOLN
INTRAVENOUS | Status: AC
  Filled 2020-09-05: qty 200

## 2020-09-05 MED ORDER — PROMETHAZINE HCL 25 MG/ML IJ SOLN: 6.2500 mg | INTRAMUSCULAR | Status: DC | PRN

## 2020-09-05 MED ORDER — CHLORHEXIDINE GLUCONATE 0.12 % MT SOLN
15.0000 mL | Freq: Once | OROMUCOSAL | Status: AC
  Administered 2020-09-05: 15 mL via OROMUCOSAL

## 2020-09-05 MED ORDER — BUPIVACAINE HCL 0.25 % IJ SOLN
INTRAMUSCULAR | Status: AC
  Filled 2020-09-05: qty 1

## 2020-09-05 MED ORDER — AMISULPRIDE (ANTIEMETIC) 5 MG/2ML IV SOLN: 10.0000 mg | Freq: Once | INTRAVENOUS | Status: DC | PRN

## 2020-09-05 MED ORDER — HYDRALAZINE HCL 20 MG/ML IJ SOLN
INTRAMUSCULAR | Status: AC
  Administered 2020-09-05: 5 mg via INTRAVENOUS
  Filled 2020-09-05: qty 1

## 2020-09-05 SURGICAL SUPPLY — 30 items
ATTRACTOMAT 16X20 MAGNETIC DRP (DRAPES) ×2 IMPLANT
BAG COUNTER SPONGE SURGICOUNT (BAG) IMPLANT
BLADE SURG 15 STRL LF DISP TIS (BLADE) ×1 IMPLANT
BLADE SURG 15 STRL SS (BLADE) ×1
CHLORAPREP W/TINT 26 (MISCELLANEOUS) ×2 IMPLANT
CLIP VESOCCLUDE MED 6/CT (CLIP) ×4 IMPLANT
CLIP VESOCCLUDE SM WIDE 6/CT (CLIP) ×4 IMPLANT
COVER SURGICAL LIGHT HANDLE (MISCELLANEOUS) ×2 IMPLANT
DERMABOND ADVANCED (GAUZE/BANDAGES/DRESSINGS) ×1
DERMABOND ADVANCED .7 DNX12 (GAUZE/BANDAGES/DRESSINGS) ×1 IMPLANT
DRAPE LAPAROTOMY T 98X78 PEDS (DRAPES) ×2 IMPLANT
DRAPE UTILITY XL STRL (DRAPES) ×2 IMPLANT
ELECT REM PT RETURN 15FT ADLT (MISCELLANEOUS) ×2 IMPLANT
GAUZE 4X4 16PLY ~~LOC~~+RFID DBL (SPONGE) ×2 IMPLANT
GLOVE SURG ORTHO LTX SZ8 (GLOVE) ×2 IMPLANT
GOWN STRL REUS W/TWL XL LVL3 (GOWN DISPOSABLE) ×6 IMPLANT
HEMOSTAT SURGICEL 2X4 FIBR (HEMOSTASIS) ×2 IMPLANT
ILLUMINATOR WAVEGUIDE N/F (MISCELLANEOUS) IMPLANT
KIT BASIN OR (CUSTOM PROCEDURE TRAY) ×2 IMPLANT
KIT TURNOVER KIT A (KITS) ×2 IMPLANT
NEEDLE HYPO 25X1 1.5 SAFETY (NEEDLE) ×2 IMPLANT
PACK BASIC VI WITH GOWN DISP (CUSTOM PROCEDURE TRAY) ×2 IMPLANT
PENCIL SMOKE EVACUATOR (MISCELLANEOUS) IMPLANT
SUT MNCRL AB 4-0 PS2 18 (SUTURE) ×2 IMPLANT
SUT VIC AB 3-0 SH 18 (SUTURE) ×2 IMPLANT
SYR BULB IRRIG 60ML STRL (SYRINGE) ×2 IMPLANT
SYR CONTROL 10ML LL (SYRINGE) ×2 IMPLANT
TOWEL OR 17X26 10 PK STRL BLUE (TOWEL DISPOSABLE) ×2 IMPLANT
TOWEL OR NON WOVEN STRL DISP B (DISPOSABLE) ×2 IMPLANT
TUBING CONNECTING 10 (TUBING) ×2 IMPLANT

## 2020-09-05 NOTE — Discharge Instructions (Addendum)
[facility]  THYROID & PARATHYROID SURGERY:  POST-OP INSTRUCTIONS  Always review your discharge instruction sheet from the facility where your surgery was performed.  A prescription for pain medication may be given to you upon discharge.  Take your pain medication as prescribed.  If narcotic pain medicine is not needed, then you may take acetaminophen (Tylenol) or ibuprofen (Advil) as needed.  Take your usually prescribed medications unless otherwise directed.  If you need a refill on your pain medication, please contact our office during regular business hours.  Prescriptions cannot be processed by our office after 5 pm or on weekends.  Start with a light diet upon arrival home, such as soup and crackers or toast.  Be sure to drink plenty of fluids daily.  Resume your normal diet the day after surgery.  Most patients will experience some swelling and bruising on the chest and neck area.  Ice packs will help.  Swelling and bruising can take several days to resolve.   It is common to experience some constipation after surgery.  Increasing fluid intake and taking a stool softener (Colace) will usually help or prevent this problem.  A mild laxative (Milk of Magnesia or Miralax) should be taken according to package directions if there has been no bowel movement after 48 hours.  You have steri-strips and a gauze dressing over your incision.  You may remove the gauze bandage on the second day after surgery, and you may shower at that time.  Leave your steri-strips (small skin tapes) in place directly over the incision.  These strips should remain on the skin for 5-7 days and then be removed.  You may get them wet in the shower and pat them dry.  You may resume regular (light) daily activities beginning the next day (such as daily self-care, walking, climbing stairs) gradually increasing activities as tolerated.  You may have sexual intercourse when it is comfortable.  Refrain from  any heavy lifting or straining until approved by your doctor.  You may drive when you no longer are taking prescription pain medication, you can comfortably wear a seatbelt, and you can safely maneuver your car and apply brakes.  You should see your doctor in the office for a follow-up appointment approximately three weeks after your surgery.  Make sure that you call for this appointment within a day or two after you arrive home to insure a convenient appointment time.  WHEN TO CALL YOUR DOCTOR: -- Fever greater than 101.5 -- Inability to urinate -- Nausea and/or vomiting - persistent -- Extreme swelling or bruising -- Continued bleeding from incision -- Increased pain, redness, or drainage from the incision -- Difficulty swallowing or breathing -- Muscle cramping or spasms -- Numbness or tingling in hands or around lips  The clinic staff is available to answer your questions during regular business hours.  Please don't hesitate to call and ask to speak to one of the nurses if you have concerns.  Armandina Gemma, MD [facility] Office: [place]  THYROID & PARATHYROID SURGERY:  POST-OP INSTRUCTIONS  Always review your discharge instruction sheet from the facility where your surgery was performed.  A prescription for pain medication may be given to you upon discharge.  Take your pain medication as prescribed.  If narcotic pain medicine is not needed, then you may take acetaminophen (Tylenol) or ibuprofen (Advil) as needed.  Take your usually prescribed medications unless otherwise directed.  If you need a refill  on your pain medication, please contact our office during regular business hours.  Prescriptions cannot be processed by our office after 5 pm or on weekends.  Start with a light diet upon arrival home, such as soup and crackers or toast.  Be sure to drink plenty of fluids daily.  Resume your normal diet the day after  surgery.  Most patients will experience some swelling and bruising on the chest and neck area.  Ice packs will help.  Swelling and bruising can take several days to resolve.   It is common to experience some constipation after surgery.  Increasing fluid intake and taking a stool softener (Colace) will usually help or prevent this problem.  A mild laxative (Milk of Magnesia or Miralax) should be taken according to package directions if there has been no bowel movement after 48 hours.  You have steri-strips and a gauze dressing over your incision.  You may remove the gauze bandage on the second day after surgery, and you may shower at that time.  Leave your steri-strips (small skin tapes) in place directly over the incision.  These strips should remain on the skin for 5-7 days and then be removed.  You may get them wet in the shower and pat them dry.  You may resume regular (light) daily activities beginning the next day (such as daily self-care, walking, climbing stairs) gradually increasing activities as tolerated.  You may have sexual intercourse when it is comfortable.  Refrain from any heavy lifting or straining until approved by your doctor.  You may drive when you no longer are taking prescription pain medication, you can comfortably wear a seatbelt, and you can safely maneuver your car and apply brakes.  You should see your doctor in the office for a follow-up appointment approximately three weeks after your surgery.  Make sure that you call for this appointment within a day or two after you arrive home to insure a convenient appointment time.  WHEN TO CALL YOUR DOCTOR: -- Fever greater than 101.5 -- Inability to urinate -- Nausea and/or vomiting - persistent -- Extreme swelling or bruising -- Continued bleeding from incision -- Increased pain, redness, or drainage from the incision -- Difficulty swallowing or breathing -- Muscle cramping or spasms -- Numbness or tingling in hands or  around lips  The clinic staff is available to answer your questions during regular business hours.  Please don't hesitate to call and ask to speak to one of the nurses if you have concerns.  Armandina Gemma, MD [facility] Office: [phone]

## 2020-09-05 NOTE — Anesthesia Preprocedure Evaluation (Addendum)
Anesthesia Evaluation  Patient identified by MRN, date of birth, ID band Patient awake    Reviewed: Allergy & Precautions, NPO status , Patient's Chart, lab work & pertinent test results  Airway Mallampati: II  TM Distance: >3 FB Neck ROM: Full    Dental no notable dental hx. (+) Teeth Intact, Dental Advisory Given   Pulmonary former smoker,  5 pack year history    Pulmonary exam normal breath sounds clear to auscultation       Cardiovascular negative cardio ROS Normal cardiovascular exam Rhythm:Regular Rate:Normal     Neuro/Psych negative neurological ROS  negative psych ROS   GI/Hepatic Neg liver ROS, hiatal hernia (repaired),   Endo/Other  Primary hyperparathyroidism   Renal/GU negative Renal ROS  negative genitourinary   Musculoskeletal negative musculoskeletal ROS (+)   Abdominal   Peds  Hematology negative hematology ROS (+) hct 42.2   Anesthesia Other Findings   Reproductive/Obstetrics negative OB ROS                           Anesthesia Physical Anesthesia Plan  ASA: 2  Anesthesia Plan: General   Post-op Pain Management:    Induction: Intravenous  PONV Risk Score and Plan: 4 or greater and Ondansetron, Dexamethasone, Scopolamine patch - Pre-op, Midazolam and Treatment may vary due to age or medical condition  Airway Management Planned: Oral ETT  Additional Equipment: None  Intra-op Plan:   Post-operative Plan: Extubation in OR  Informed Consent: I have reviewed the patients History and Physical, chart, labs and discussed the procedure including the risks, benefits and alternatives for the proposed anesthesia with the patient or authorized representative who has indicated his/her understanding and acceptance.     Dental advisory given  Plan Discussed with: CRNA  Anesthesia Plan Comments:        Anesthesia Quick Evaluation

## 2020-09-05 NOTE — Interval H&P Note (Signed)
History and Physical Interval Note:  09/05/2020 11:11 AM  Loretta Young  has presented today for surgery, with the diagnosis of PRIMARY HYPERPARATHYROIDISM.  The various methods of treatment have been discussed with the patient and family. After consideration of risks, benefits and other options for treatment, the patient has consented to    Procedure(s): LEFT INFERIOR PARATHYROIDECTOMY (Left) as a surgical intervention.    The patient's history has been reviewed, patient examined, no change in status, stable for surgery.  I have reviewed the patient's chart and labs.  Questions were answered to the patient's satisfaction.    Armandina Gemma, MD [facility] Office: [place]

## 2020-09-05 NOTE — Transfer of Care (Signed)
Immediate Anesthesia Transfer of Care Note  Patient: Loretta Young  Procedure(s) Performed: LEFT INFERIOR PARATHYROIDECTOMY (Left: Neck)  Patient Location: PACU  Anesthesia Type:General  Level of Consciousness: awake, alert , oriented and patient cooperative  Airway & Oxygen Therapy: Patient Spontanous Breathing and Patient connected to face mask oxygen  Post-op Assessment: Report given to RN, Post -op Vital signs reviewed and stable and Patient moving all extremities  Post vital signs: Reviewed and stable  Last Vitals:  Vitals Value Taken Time  BP 158/113 09/05/20 1239  Temp    Pulse 87 09/05/20 1242  Resp 24 09/05/20 1242  SpO2 100 % 09/05/20 1242  Vitals shown include unvalidated device data.  Last Pain:  Vitals:   09/05/20 0935  TempSrc: Oral         Complications: No notable events documented.

## 2020-09-05 NOTE — Anesthesia Procedure Notes (Signed)
Procedure Name: Intubation Date/Time: 09/05/2020 11:37 AM Performed by: Victoriano Lain, CRNA Pre-anesthesia Checklist: Patient identified, Emergency Drugs available, Patient being monitored, Suction available and Timeout performed Patient Re-evaluated:Patient Re-evaluated prior to induction Oxygen Delivery Method: Circle system utilized Preoxygenation: Pre-oxygenation with 100% oxygen Induction Type: IV induction Ventilation: Mask ventilation without difficulty Laryngoscope Size: Mac and 3 Grade View: Grade I Tube size: 7.5 mm Number of attempts: 1 Airway Equipment and Method: Stylet Placement Confirmation: ETT inserted through vocal cords under direct vision, positive ETCO2 and breath sounds checked- equal and bilateral Secured at: 21 cm Tube secured with: Tape Dental Injury: Teeth and Oropharynx as per pre-operative assessment  Comments: DL x1 Charyl Bigger, SRNA

## 2020-09-05 NOTE — Op Note (Signed)
OPERATIVE REPORT - PARATHYROIDECTOMY  Preoperative diagnosis: Primary hyperparathyroidism  Postop diagnosis: Same  Procedure: Left inferior minimally invasive parathyroidectomy  Surgeon:  Armandina Gemma, MD  Anesthesia: General endotracheal  Estimated blood loss: Minimal  Preparation: ChloraPrep  Indications: Patient is referred by Rayetta Pigg, FNP, for surgical evaluation and recommendations regarding primary hyperparathyroidism.  Patient was recently noted on routine laboratory studies to have elevated serum calcium levels.  She was referred to endocrinology for further evaluation.  Levels in March 2022 included a calcium level of 11.1 and an intact PTH level of 142.  Vitamin D level was 38.  24-hour urine collection for calcium was normal at 226.  Patient had developed symptoms including chronic fatigue, a history of nephrolithiasis, and a recent bone density scan showing evidence of osteoporosis.  Patient underwent nuclear medicine parathyroid scanning as well as an ultrasound examination of the neck.  Both of the studies demonstrated evidence of a 2.4 cm parathyroid adenoma in the left inferior position.  Patient now comes to surgery for minimally invasive parathyroidectomy.  Procedure: The patient was prepared in the pre-operative holding area. The patient was brought to the operating room and placed in a supine position on the operating room table. Following administration of general anesthesia, the patient was positioned and then prepped and draped in the usual strict aseptic fashion. After ascertaining that an adequate level of anesthesia been achieved, a neck incision was made with a #15 blade. Dissection was carried through subcutaneous tissues and platysma. Hemostasis was obtained with the electrocautery. Skin flaps were developed circumferentially and a Weitlander retractor was placed for exposure.  Strap muscles were incised in the midline. Strap muscles were reflected laterally  exposing the thyroid lobe. With gentle blunt dissection the thyroid lobe was mobilized.  Dissection was carried through adipose tissue and an enlarged parathyroid gland was identified. It was gently mobilized. Vascular structures were divided between small ligaclips. Care was taken to avoid the recurrent laryngeal nerve and the esophagus. The parathyroid gland was completely excised. It was submitted to pathology where frozen section confirmed parathyroid tissue consistent with adenoma.  Neck was irrigated with warm saline and good hemostasis was noted. Fibrillar was placed in the operative field. Strap muscles were approximated in the midline with interrupted 3-0 Vicryl sutures. Platysma was closed with interrupted 3-0 Vicryl sutures. Marcaine was infiltrated circumferentially. Skin was closed with a running 4-0 Monocryl subcuticular suture. Wound was washed and dried and Dermabond was applied. Patient was awakened from anesthesia and brought to the recovery room. The patient tolerated the procedure well.   Armandina Gemma, MD [facility] Office: [phone]

## 2020-09-05 NOTE — Anesthesia Postprocedure Evaluation (Signed)
Anesthesia Post Note  Patient: Loretta Young  Procedure(s) Performed: LEFT INFERIOR PARATHYROIDECTOMY (Left: Neck)     Patient location during evaluation: PACU Anesthesia Type: General Level of consciousness: awake and alert, oriented and patient cooperative Pain management: pain level controlled Vital Signs Assessment: post-procedure vital signs reviewed and stable Respiratory status: spontaneous breathing, nonlabored ventilation and respiratory function stable Cardiovascular status: blood pressure returned to baseline and stable Postop Assessment: no apparent nausea or vomiting Anesthetic complications: no   No notable events documented.  Last Vitals:  Vitals:   09/05/20 1330 09/05/20 1410  BP: (!) 142/81 (!) 152/96  Pulse: 65 73  Resp: (!) 9 14  Temp:  36.8 C  SpO2: 98% 96%    Last Pain:  Vitals:   09/05/20 1410  TempSrc:   PainSc: [place]

## 2020-09-06 ENCOUNTER — Encounter (HOSPITAL_COMMUNITY): Payer: Self-pay | Admitting: Surgery

## 2020-09-06 LAB — SURGICAL PATHOLOGY

## 2020-09-08 ENCOUNTER — Telehealth: Payer: Self-pay

## 2020-09-08 NOTE — Telephone Encounter (Signed)
It is not uncommon to experience these symptoms after parathyroidectomy.  It is likely due to a shift in calcium levels in the blood.  It should start to improve over the next few days.  Nevertheless, this is why we will repeat labs soon (next week) and monitor calcium levels and replenish if we need to.

## 2020-09-08 NOTE — Telephone Encounter (Signed)
Patient states she had her parathyroidectomy on 6/27. She said she is not sure what she is suppose to be doing now/ she is shaky and jittery.

## 2020-09-08 NOTE — Telephone Encounter (Signed)
Lft msg for pt. Also let her know that she can see this msg in my chart as well.

## 2020-09-14 LAB — PTH, INTACT AND CALCIUM
Calcium: 9.4 mg/dL (ref 8.7–10.3)
PTH: 33 pg/mL (ref 15–65)

## 2020-09-14 LAB — TSH: TSH: 0.844 u[IU]/mL (ref 0.450–4.500)

## 2020-09-14 LAB — T4, FREE: Free T4: 1.28 ng/dL (ref 0.82–1.77)

## 2020-09-16 ENCOUNTER — Encounter: Payer: Self-pay | Admitting: Nurse Practitioner

## 2020-09-16 ENCOUNTER — Ambulatory Visit: Payer: BC Managed Care – PPO | Admitting: Nurse Practitioner

## 2020-09-16 VITALS — BP 132/80 | HR 79 | Ht 68.0 in | Wt 215.0 lb

## 2020-09-16 DIAGNOSIS — E892 Postprocedural hypoparathyroidism: Secondary | ICD-10-CM

## 2020-09-16 DIAGNOSIS — E042 Nontoxic multinodular goiter: Secondary | ICD-10-CM

## 2020-09-16 DIAGNOSIS — E210 Primary hyperparathyroidism: Secondary | ICD-10-CM

## 2020-09-16 NOTE — Progress Notes (Signed)
Endocrinology Follow Up Note        09/16/2020, 8:42 AM      Loretta Young is a 60 y.o.-year-old female, referred by her  Loretta Murdoch, FNP  , for evaluation for hypercalcemia/hyperparathyroidism.   Past Medical History:  Diagnosis Date   Cancer ([place])    Hyperlipidemia     Past Surgical History:  Procedure Laterality Date   ABDOMINAL HYSTERECTOMY     CESAREAN SECTION     HIATAL HERNIA REPAIR     l4-l5 fusion     PARATHYROIDECTOMY Left 09/05/2020   Procedure: LEFT INFERIOR PARATHYROIDECTOMY;  Surgeon: Loretta Gemma, MD;  Location: [facility];  Service: General;  Laterality: Left;   SPINAL FUSION      Social History   Tobacco Use   Smoking status: Former    Packs/day: 0.50    Years: 10.00    Pack years: 5.00    Types: Cigarettes   Smokeless tobacco: Never   Tobacco comments:    Quit 35 years ago  Vaping Use   Vaping Use: Never used  Substance Use Topics   Drug use: Never    History reviewed. No pertinent family history.  Outpatient Encounter Medications as of 09/16/2020  Medication Sig   Cholecalciferol 50 MCG (2000 UT) TABS Take 2,000 Units by mouth at bedtime.   ezetimibe (ZETIA) 10 MG tablet Take 10 mg by mouth at bedtime.   NON FORMULARY Place 1 application vaginally daily. Cs-e2 0.2/te 1 Nat Whip vaginal cream   omeprazole (PRILOSEC) 40 MG capsule Take 40 mg by mouth daily with breakfast.   [DISCONTINUED] diphenhydrAMINE HCl (ZZZQUIL) 50 MG/30ML LIQD Take 50 mg by mouth at bedtime.   [DISCONTINUED] furosemide (LASIX) 20 MG tablet Take 1 tablet (20 mg total) by mouth daily as needed. (Patient not taking: Reported on 08/29/2020)   [DISCONTINUED] traMADol (ULTRAM) 50 MG tablet Take 1-2 tablets (50-100 mg total) by mouth every 6 (six) hours as needed for moderate pain.   No facility-administered encounter medications on file as of 09/16/2020.    Allergies  Allergen Reactions   Mirabegron Anaphylaxis    Imported  allergy: Patient doesn't recall   Oxycodone Nausea And Vomiting   Penicillins Hives   Sulfa Antibiotics Hives     HPI  Loretta Young was diagnosed with hypercalcemia in February, 2022.  Patient has no previously known history of parathyroid, pituitary, adrenal dysfunctions; + family history of hyperparathyroidism in her mother. -Review of her referral package of most recent labs reveals calcium of 11.1 the corresponding PTH of 142 on 05/20/20.  We discussed pt's DEXA scans: no report available to review but patient reports she is osteopenic, borderline osteoporotic.  Report from [facility] center shows T-score of -2.5 indicating osteoporosis.  No prior history of fragility fractures or falls. + history of kidney stones.  No history of CKD. Last BUN/Cr: 21/0.7  she is not on HCTZ or other thiazide therapy.  + history of vitamin D deficiency. Last vitamin D level was 38 in 05/02/20 (on replacement therapy).  she is not currently on calcium supplements (had recently been started on supplement by her PCP after her Dexa scan results showed osteopenia as she did not want  to start Prolia at that time, however she was told to stop the supplementation at our initial visit). she eats dairy and green, leafy, vegetables on average amounts.  She does not eat meat, therefore the majority of her meals consist of various salads.  she does not have a family history of pituitary tumors, thyroid cancer, or osteoporosis.   I reviewed her chart and she also has a history of HLD.    Review of systems  Constitutional: + Minimally fluctuating body weight,  current Body mass index is 32.69 kg/m. , no fatigue, no subjective hyperthermia, no subjective hypothermia Eyes: no blurry vision, no xerophthalmia ENT: no sore throat, no nodules palpated in throat, no dysphagia/odynophagia, no hoarseness Cardiovascular: no chest pain, no shortness of breath, no palpitations, no leg swelling Respiratory: no cough, no  shortness of breath Gastrointestinal: no nausea/vomiting/diarrhea Musculoskeletal: no muscle/joint aches Skin: no rashes, no hyperemia Neurological: no tremors, no numbness, no tingling, no dizziness Psychiatric: no depression, no anxiety  ----------------------------------------------------------------------------------------------------------------------------- OBJECTIVE:  BP 132/80   Pulse 79   Ht 5\' 8"  (1.727 m)   Wt 215 lb (97.5 kg)   BMI 32.69 kg/m , Body mass index is 32.69 kg/m. Wt Readings from Last 3 Encounters:  09/16/20 215 lb (97.5 kg)  06/01/20 213 lb (96.6 kg)    BP Readings from Last 3 Encounters:  09/16/20 132/80  09/05/20 (!) 152/96  09/02/20 (!) 141/92    Physical Exam- Limited  Constitutional:  Body mass index is 32.69 kg/m. , not in acute distress, normal state of mind Eyes:  EOMI, no exophthalmos Neck: Supple Cardiovascular: RRR, no murmurs, rubs, or gallops, no edema Respiratory: Adequate breathing efforts, no crackles, rales, rhonchi, or wheezing Musculoskeletal: no gross deformities, strength intact in all four extremities, no gross restriction of joint movements Skin:  no rashes, no hyperemia Neurological: no tremor with outstretched hands   CMP ( most recent) CMP     Component Value Date/Time   BUN 21 05/04/2020 0000   CREATININE 0.7 05/04/2020 0000   CALCIUM 9.4 09/13/2020 0805   GFRNONAA 83 05/04/2020 0000     Diabetic Labs (most recent): No results found for: HGBA1C   Lipid Panel ( most recent) Lipid Panel     Component Value Date/Time   CHOL 214 (A) 05/02/2020 0000   LDLCALC 125 05/02/2020 0000      Lab Results  Component Value Date   TSH 0.844 09/13/2020   FREET4 1.28 09/13/2020    Thyroid US from 08/03/20 CLINICAL DATA:  Other.  Hyperparathyroidism.   EXAM: THYROID ULTRASOUND   TECHNIQUE: Ultrasound examination of the thyroid gland and adjacent soft tissues was performed.   COMPARISON:  None.    FINDINGS: Parenchymal Echotexture: Mildly heterogenous   Isthmus: 0.1 cm   Right lobe: 4.2 x 1.9 x 1.7 cm   Left lobe: 4.3 x 1.5 x 1.7 cm   _________________________________________________________   Estimated total number of nodules >/= 1 cm: 3   Number of spongiform nodules >/=  2 cm not described below (TR1): 0   Number of mixed cystic and solid nodules >/= 1.5 cm not described below (TR2): 0   _________________________________________________________   Nodule # 1:   Location: Right; Mid   Maximum size: 1.0 cm; Other 2 dimensions: 0.6 x 0.5 cm   Composition: solid/almost completely solid (2)   Echogenicity: hypoechoic (2)   Shape: not taller-than-wide (0)   Margins: smooth (0)   Echogenic foci: none (0)   ACR TI-RADS total points:  4.   ACR TI-RADS risk category: TR4 (4-6 points).   ACR TI-RADS recommendations:   *Given size (>/= 1 - 1.4 cm) and appearance, a follow-up ultrasound in 1 year should be considered based on TI-RADS criteria.   _________________________________________________________   Nodule # 2:   Location: Left; Superior   Maximum size: 1.3 cm; Other 2 dimensions: 1.0 x 0.8 cm   Composition: solid/almost completely solid (2)   Echogenicity: hypoechoic (2)   Shape: not taller-than-wide (0)   Margins: smooth (0)   Echogenic foci: none (0) - a few small linear echogenic foci scattered throughout the nodule are favored to represent the posterior wall of internal microcystic components rather than true punctate echogenic foci.   ACR TI-RADS total points: 4.   ACR TI-RADS risk category: TR4 (4-6 points).   ACR TI-RADS recommendations:   *Given size (>/= 1 - 1.4 cm) and appearance, a follow-up ultrasound in 1 year should be considered based on TI-RADS criteria.   _________________________________________________________   Nodule # 3:   Location: Left; Inferior   Maximum size: 2.4 cm; Other 2 dimensions: 1.5 x 1.1 cm    Composition: solid/almost completely solid (2)   Echogenicity: hypoechoic (2)   Shape: not taller-than-wide (0)   Margins: smooth (0)   Echogenic foci: none (0)   ACR TI-RADS total points: 4.   ACR TI-RADS risk category: TR4 (4-6 points).   ACR TI-RADS recommendations:   *Given size (>/= 1 - 1.4 cm) and appearance, a follow-up ultrasound in 1 year should be considered based on TI-RADS criteria.   _________________________________________________________   IMPRESSION: 1. Three thyroid nodules are identified as detailed above. Each of the 3 nodules meet criteria for imaging surveillance. Recommend follow-up ultrasound in 1 year.   The above is in keeping with the ACR TI-RADS recommendations - J Am Coll Radiol 2017;14:587-595.     Electronically Signed   By: Jacqulynn Cadet M.D.   On: 08/03/2020 11:26  ------------------------------------------------------------------------------------------------------------------------------ Assessment / PLAN:  1. Primary hyperparathyroidism s/p parathyroidectomy on 09/05/20-   Patient has had several instances of elevated calcium, with the highest level being at 11.1 mg/dL. A corresponding intact PTH level was also high, at 142.   - She does have apparent complications from hypercalcemia/hyperparathyroidism with nephrolithiasis and osteopenia.    -She was referred to Dr. Armandina Young with [facility] for parathyroidectomy of the overactive gland which she underwent surgery on 09/05/20.  She is 1 week postop and her post op labs show improvement in her calcium levels and PTH levels.  2. Multinodular Thyroid  She had thyroid ultrasound 08/03/20 which showed 3 nodules which recommended follow up in 1 year.  I did check thyroid function tests prior to today's visit to be sure no inadvertent damage was done to the thyroid tissue during her parathyroidectomy, and they were all WNL.      I spent 30 minutes in the care of the  patient today including review of labs from Thyroid Function, CMP, and other relevant labs ; imaging/biopsy records (current and previous including abstractions from other facilities); face-to-face time discussing  her lab results and symptoms, medications doses, her options of short and long term treatment based on the latest standards of care / guidelines;   and documenting the encounter.  Lexine Baton  participated in the discussions, expressed understanding, and voiced agreement with the above plans.  All questions were answered to her satisfaction. she is encouraged to contact clinic should she have any questions or concerns  prior to her return visit.   Follow Up PLAN: - Return in about 6 months (around 03/19/2021) for hyperparathyroidism- s/p parathyroidectomy, Previsit labs.   Rayetta Pigg, [facility] [address] [place], [place] [address] Phone: [phone] Fax: [phone]    09/16/2020, 8:42 AM

## 2020-09-30 ENCOUNTER — Telehealth: Payer: Self-pay

## 2020-09-30 NOTE — Telephone Encounter (Signed)
Per pt, faxed lab results to Dr Tera Helper office

## 2021-03-20 ENCOUNTER — Other Ambulatory Visit: Payer: Self-pay | Admitting: Nurse Practitioner

## 2021-03-21 LAB — PTH, INTACT AND CALCIUM
Calcium: 9.5 mg/dL (ref 8.7–10.3)
PTH: 50 pg/mL (ref 15–65)

## 2021-03-21 LAB — TSH: TSH: 1.32 u[IU]/mL (ref 0.450–4.500)

## 2021-03-27 ENCOUNTER — Other Ambulatory Visit: Payer: Self-pay

## 2021-03-27 ENCOUNTER — Ambulatory Visit (INDEPENDENT_AMBULATORY_CARE_PROVIDER_SITE_OTHER): Payer: BC Managed Care – PPO | Admitting: Nurse Practitioner

## 2021-03-27 ENCOUNTER — Encounter: Payer: Self-pay | Admitting: Nurse Practitioner

## 2021-03-27 VITALS — BP 102/70 | HR 80 | Ht 68.0 in | Wt 219.2 lb

## 2021-03-27 DIAGNOSIS — E042 Nontoxic multinodular goiter: Secondary | ICD-10-CM | POA: Diagnosis not present

## 2021-03-27 DIAGNOSIS — E892 Postprocedural hypoparathyroidism: Secondary | ICD-10-CM

## 2021-03-27 DIAGNOSIS — E210 Primary hyperparathyroidism: Secondary | ICD-10-CM

## 2021-03-27 NOTE — Progress Notes (Signed)
Endocrinology Follow Up Note        03/27/2021, 9:42 AM      Loretta Young is a 61 y.o.-year-old female, referred by her  Theone Murdoch, FNP  , for evaluation for hypercalcemia/hyperparathyroidism and MNG.   Past Medical History:  Diagnosis Date   Cancer ([place])    Hyperlipidemia     Past Surgical History:  Procedure Laterality Date   ABDOMINAL HYSTERECTOMY     CESAREAN SECTION     HIATAL HERNIA REPAIR     l4-l5 fusion     PARATHYROIDECTOMY Left 09/05/2020   Procedure: LEFT INFERIOR PARATHYROIDECTOMY;  Surgeon: Armandina Gemma, MD;  Location: [facility];  Service: General;  Laterality: Left;   SPINAL FUSION      Social History   Tobacco Use   Smoking status: Former    Packs/day: 0.50    Years: 10.00    Pack years: 5.00    Types: Cigarettes   Smokeless tobacco: Never   Tobacco comments:    Quit 35 years ago  Vaping Use   Vaping Use: Never used  Substance Use Topics   Drug use: Never    History reviewed. No pertinent family history.  Outpatient Encounter Medications as of 03/27/2021  Medication Sig   amLODipine (NORVASC) 2.5 MG tablet Take 2.5 mg by mouth daily.   Cholecalciferol 25 MCG (1000 UT) capsule Take 2,000 Units by mouth.   ezetimibe (ZETIA) 10 MG tablet Take 10 mg by mouth at bedtime.   losartan (COZAAR) 25 MG tablet Take 25 mg by mouth daily.   NON FORMULARY Place 1 application vaginally daily. Cs-e2 0.2/te 1 Nat Whip vaginal cream   omeprazole (PRILOSEC) 40 MG capsule Take 40 mg by mouth daily with breakfast.   No facility-administered encounter medications on file as of 03/27/2021.    Allergies  Allergen Reactions   Mirabegron Anaphylaxis    Imported allergy: Patient doesn't recall   Nirmatrelvir-Ritonavir Nausea And Vomiting    Paxlovid   Oxycodone Nausea And Vomiting    Percodan   Penicillins Hives   Sulfa Antibiotics Hives   Sulfamethoxazole-Trimethoprim     Other reaction(s): Unknown      HPI  Loretta Young was diagnosed with hypercalcemia in February, 2022.  Patient has no previously known history of parathyroid, pituitary, adrenal dysfunctions; + family history of hyperparathyroidism in her mother.  We discussed pt's DEXA scans: no report available to review but patient reports she is osteopenic, borderline osteoporotic.  Report from [facility] center shows T-score of -2.5 indicating osteoporosis.  No prior history of fragility fractures or falls. + history of kidney stones.  No history of CKD. Last BUN/Cr: 21/0.7  she is not on HCTZ or other thiazide therapy.  + history of vitamin D deficiency. Last vitamin D level was 38 in 05/02/20 (on replacement therapy).  she is not currently on calcium supplements (had recently been started on supplement by her PCP after her Dexa scan results showed osteopenia as she did not want to start Prolia at that time, however she was told to stop the supplementation at our initial visit). she eats dairy and green, leafy, vegetables on average amounts.  She does not eat meat, therefore the  majority of her meals consist of various salads.  she does not have a family history of pituitary tumors, thyroid cancer, or osteoporosis. She had parathyroidectomy on 09/05/20.  I reviewed her chart and she also has a history of HLD.   Review of systems  Constitutional: + steadily increasing body weight,  current Body mass index is 33.33 kg/m. , + fatigue, no subjective hyperthermia, no subjective hypothermia Eyes: no blurry vision, no xerophthalmia ENT: no sore throat, no nodules palpated in throat, no dysphagia/odynophagia, no hoarseness Cardiovascular: no chest pain, no shortness of breath, no palpitations, no leg swelling, had to start second BP med due to high BPs Respiratory: no cough, no shortness of breath Gastrointestinal: no nausea/vomiting, + diarrhea- sees GI Musculoskeletal: + back and hip pain Skin: no rashes, no  hyperemia Neurological: no tremors, no numbness, no tingling, no dizziness Psychiatric: no depression, no anxiety  ----------------------------------------------------------------------------------------------------------------------------- OBJECTIVE:  BP 102/70    Pulse 80    Ht 5\' 8"  (1.727 m)    Wt 219 lb 3.2 oz (99.4 kg)    SpO2 99%    BMI 33.33 kg/m , Body mass index is 33.33 kg/m. Wt Readings from Last 3 Encounters:  03/27/21 219 lb 3.2 oz (99.4 kg)  09/16/20 215 lb (97.5 kg)  06/01/20 213 lb (96.6 kg)    BP Readings from Last 3 Encounters:  03/27/21 102/70  09/16/20 132/80  09/05/20 (!) 152/96     Physical Exam- Limited  Constitutional:  Body mass index is 33.33 kg/m. , not in acute distress, normal state of mind Eyes:  EOMI, no exophthalmos Neck: Supple Cardiovascular: RRR, no murmurs, rubs, or gallops, no edema Respiratory: Adequate breathing efforts, no crackles, rales, rhonchi, or wheezing Musculoskeletal: no gross deformities, strength intact in all four extremities, no gross restriction of joint movements Skin:  no rashes, no hyperemia Neurological: no tremor with outstretched hands   CMP ( most recent) CMP     Component Value Date/Time   BUN 21 05/04/2020 0000   CREATININE 0.7 05/04/2020 0000   CALCIUM 9.5 03/20/2021 1029   GFRNONAA 83 05/04/2020 0000     Diabetic Labs (most recent): No results found for: HGBA1C   Lipid Panel ( most recent) Lipid Panel     Component Value Date/Time   CHOL 214 (A) 05/02/2020 0000   LDLCALC 125 05/02/2020 0000      Lab Results  Component Value Date   TSH 1.320 03/20/2021   TSH 0.844 09/13/2020   FREET4 1.28 09/13/2020    Thyroid US from 08/03/20 CLINICAL DATA:  Other.  Hyperparathyroidism.   EXAM: THYROID ULTRASOUND   TECHNIQUE: Ultrasound examination of the thyroid gland and adjacent soft tissues was performed.   COMPARISON:  None.   FINDINGS: Parenchymal Echotexture: Mildly heterogenous    Isthmus: 0.1 cm   Right lobe: 4.2 x 1.9 x 1.7 cm   Left lobe: 4.3 x 1.5 x 1.7 cm   _________________________________________________________   Estimated total number of nodules >/= 1 cm: 3   Number of spongiform nodules >/=  2 cm not described below (TR1): 0   Number of mixed cystic and solid nodules >/= 1.5 cm not described below (TR2): 0   _________________________________________________________   Nodule # 1:   Location: Right; Mid   Maximum size: 1.0 cm; Other 2 dimensions: 0.6 x 0.5 cm   Composition: solid/almost completely solid (2)   Echogenicity: hypoechoic (2)   Shape: not taller-than-wide (0)   Margins: smooth (0)   Echogenic foci: none (  0)   ACR TI-RADS total points: 4.   ACR TI-RADS risk category: TR4 (4-6 points).   ACR TI-RADS recommendations:   *Given size (>/= 1 - 1.4 cm) and appearance, a follow-up ultrasound in 1 year should be considered based on TI-RADS criteria.   _________________________________________________________   Nodule # 2:   Location: Left; Superior   Maximum size: 1.3 cm; Other 2 dimensions: 1.0 x 0.8 cm   Composition: solid/almost completely solid (2)   Echogenicity: hypoechoic (2)   Shape: not taller-than-wide (0)   Margins: smooth (0)   Echogenic foci: none (0) - a few small linear echogenic foci scattered throughout the nodule are favored to represent the posterior wall of internal microcystic components rather than true punctate echogenic foci.   ACR TI-RADS total points: 4.   ACR TI-RADS risk category: TR4 (4-6 points).   ACR TI-RADS recommendations:   *Given size (>/= 1 - 1.4 cm) and appearance, a follow-up ultrasound in 1 year should be considered based on TI-RADS criteria.   _________________________________________________________   Nodule # 3:   Location: Left; Inferior   Maximum size: 2.4 cm; Other 2 dimensions: 1.5 x 1.1 cm   Composition: solid/almost completely solid (2)   Echogenicity:  hypoechoic (2)   Shape: not taller-than-wide (0)   Margins: smooth (0)   Echogenic foci: none (0)   ACR TI-RADS total points: 4.   ACR TI-RADS risk category: TR4 (4-6 points).   ACR TI-RADS recommendations:   *Given size (>/= 1 - 1.4 cm) and appearance, a follow-up ultrasound in 1 year should be considered based on TI-RADS criteria.   _________________________________________________________   IMPRESSION: 1. Three thyroid nodules are identified as detailed above. Each of the 3 nodules meet criteria for imaging surveillance. Recommend follow-up ultrasound in 1 year.   The above is in keeping with the ACR TI-RADS recommendations - J Am Coll Radiol 2017;14:587-595.     Electronically Signed   By: Jacqulynn Cadet M.D.   On: 08/03/2020 11:26  ------------------------------------------------------------------------------------------------------------------------------ Assessment / PLAN:  1. Primary hyperparathyroidism s/p parathyroidectomy on 09/05/20- RESOLVED  Patient has had several instances of elevated calcium, with the highest level being at 11.1 mg/dL. A corresponding intact PTH level was also high, at 142.   - She does have apparent complications from hypercalcemia/hyperparathyroidism with nephrolithiasis and osteopenia.    -She was referred to Dr. Armandina Gemma with [facility] for parathyroidectomy of the overactive gland which she underwent surgery on 09/05/20.  She is 1 week postop and her post op labs show improvement in her calcium levels and PTH levels.  2. Multinodular Thyroid  She had thyroid ultrasound 08/03/20 which showed 3 nodules which recommended follow up in 1 year.  Her repeat thyroid function tests were normal (only had TSH drawn).  Given her family history of thyroid dysfunction, will add on antibody testing to rule out autoimmune thyroid problems.  Will plan on repeating thyroid ultrasound prior to next visit for surveillance of 3  suspicious nodules.     I spent 33 minutes in the care of the patient today including review of labs from Thyroid Function, CMP, and other relevant labs ; imaging/biopsy records (current and previous including abstractions from other facilities); face-to-face time discussing  her lab results and symptoms, medications doses, her options of short and long term treatment based on the latest standards of care / guidelines;   and documenting the encounter.  Loretta Young  participated in the discussions, expressed understanding, and voiced agreement with the  above plans.  All questions were answered to her satisfaction. she is encouraged to contact clinic should she have any questions or concerns prior to her return visit.   Follow Up PLAN: - Return in about 2 months (around 05/25/2021) for Thyroid follow up, thyroid ultrasound, Virtual visit ok.   Loretta Young, [facility] [address] [place],  [address] Phone: [phone] Fax: [phone]    03/27/2021, 9:42 AM

## 2021-04-05 ENCOUNTER — Other Ambulatory Visit: Payer: Self-pay

## 2021-04-05 ENCOUNTER — Ambulatory Visit (HOSPITAL_COMMUNITY)
Admission: RE | Admit: 2021-04-05 | Discharge: 2021-04-05 | Disposition: A | Discharge status: Home / Self Care | Payer: BC Managed Care – PPO | Source: Ambulatory Visit | Attending: Nurse Practitioner | Admitting: Nurse Practitioner

## 2021-04-05 DIAGNOSIS — E042 Nontoxic multinodular goiter: Secondary | ICD-10-CM | POA: Diagnosis present

## 2021-04-06 LAB — THYROGLOBULIN ANTIBODY: Thyroglobulin Antibody: <1 IU/mL (ref 0.0–0.9)

## 2021-04-06 LAB — TSH: TSH: 1.02 u[IU]/mL (ref 0.450–4.500)

## 2021-04-06 LAB — T4, FREE: Free T4: 1.45 ng/dL (ref 0.82–1.77)

## 2021-04-06 LAB — T3, FREE: T3, Free: 3.3 pg/mL (ref 2.0–4.4)

## 2021-04-06 LAB — THYROID PEROXIDASE ANTIBODY: Thyroperoxidase Ab SerPl-aCnc: <9 IU/mL (ref 0–34)

## 2021-05-25 ENCOUNTER — Encounter: Payer: Self-pay | Admitting: Nurse Practitioner

## 2021-05-25 ENCOUNTER — Telehealth (INDEPENDENT_AMBULATORY_CARE_PROVIDER_SITE_OTHER): Payer: BC Managed Care – PPO | Admitting: Nurse Practitioner

## 2021-05-25 VITALS — Ht 68.0 in | Wt 213.0 lb

## 2021-05-25 DIAGNOSIS — E892 Postprocedural hypoparathyroidism: Secondary | ICD-10-CM | POA: Diagnosis not present

## 2021-05-25 DIAGNOSIS — E042 Nontoxic multinodular goiter: Secondary | ICD-10-CM

## 2021-05-25 NOTE — Progress Notes (Signed)
?                                                    Endocrinology Follow Up Note  ? ?     05/25/2021, 8:29 AM ? ? ?TELEHEALTH VISIT: The patient is being engaged in telehealth visit due to COVID-19.  This type of visit limits physical examination significantly, and thus is not preferable over face-to-face encounters. ? ?I connected with  Lexine Baton on 05/25/21 by a video enabled telemedicine application and verified that I am speaking with the correct person using two identifiers. ?  ?I discussed the limitations of evaluation and management by telemedicine. The patient expressed understanding and agreed to proceed.  ? ? ?The participants involved in this visit include: Brita Romp, NP located at [facility] and Lexine Baton  located at their personal residence listed. ? ? ? ? ?Karolee Meloni is a 61 y.o.-year-old female, referred by her  Theone Murdoch, FNP  , for evaluation for hypercalcemia/hyperparathyroidism and MNG. ? ? ?Past Medical History:  ?Diagnosis Date  ? Cancer [facility])   ? Hyperlipidemia   ? ? ?Past Surgical History:  ?Procedure Laterality Date  ? ABDOMINAL HYSTERECTOMY    ? CESAREAN SECTION    ? HIATAL HERNIA REPAIR    ? l4-l5 fusion    ? PARATHYROIDECTOMY Left 09/05/2020  ? Procedure: LEFT INFERIOR PARATHYROIDECTOMY;  Surgeon: Armandina Gemma, MD;  Location: [facility];  Service: General;  Laterality: Left;  ? SPINAL FUSION    ? ? ?Social History  ? ?Tobacco Use  ? Smoking status: Former  ?  Packs/day: 0.50  ?  Years: 10.00  ?  Pack years: 5.00  ?  Types: Cigarettes  ? Smokeless tobacco: Never  ? Tobacco comments:  ?  Quit 35 years ago  ?Vaping Use  ? Vaping Use: Never used  ?Substance Use Topics  ? Alcohol use: Not Currently  ? Drug use: Never  ? ? ?History reviewed. No pertinent family history. ? ?Outpatient Encounter Medications as of 05/25/2021  ?Medication Sig  ? calcium carbonate (TUMS EX) 750 MG chewable tablet Chew by mouth.  ? CALCIUM-VITAMIN D PO Take by mouth. Takes 2  chews daily.  ? Cholecalciferol 25 MCG (1000 UT) capsule Take 2,000 Units by mouth.  ? CREON 36000-114000 units CPEP capsule Take by mouth.  ? ezetimibe (ZETIA) 10 MG tablet Take 10 mg by mouth at bedtime.  ? fluticasone (FLOVENT HFA) 220 MCG/ACT inhaler Inhale into the lungs.  ? hyoscyamine (LEVSIN SL) 0.125 MG SL tablet SMARTSIG:2 Sublingual Twice Daily  ? losartan (COZAAR) 50 MG tablet Take by mouth.  ? NON FORMULARY Place 1 application vaginally daily. Cs-e2 0.2/te 1 Nat Whip vaginal cream  ? omeprazole (PRILOSEC) 40 MG capsule Take 40 mg by mouth daily with breakfast.  ? [DISCONTINUED] amLODipine (NORVASC) 2.5 MG tablet Take 2.5 mg by mouth daily.  ? [DISCONTINUED] losartan (COZAAR) 25 MG tablet Take 25 mg by mouth daily. (Patient not taking: Reported on 05/25/2021)  ? ?No facility-administered encounter medications on file as of 05/25/2021.  ? ? ?Allergies  ?Allergen Reactions  ? Mirabegron Anaphylaxis  ?  Imported allergy: Patient doesn't recall  ? Amlodipine   ?  Other reaction(s): Headache  ? Oxycodone-Acetaminophen   ?  Other reaction(s): gi distress ?NAUSEA  ? Nirmatrelvir-Ritonavir Nausea And Vomiting  ?  Paxlovid  ? Oxycodone Nausea And Vomiting  ?  Percodan  ? Penicillins Hives  ? Sulfa Antibiotics Hives  ? Sulfamethoxazole-Trimethoprim   ?  Other reaction(s): Unknown  ? ? ? ?HPI  ?Malak Duchesneau was diagnosed with hypercalcemia in February, 2022.  Patient has no previously known history of parathyroid, pituitary, adrenal dysfunctions; + family history of hyperparathyroidism in her mother. ? ?We discussed pt's DEXA scans: no report available to review but patient reports she is osteopenic, borderline osteoporotic.  Report from [facility] center shows T-score of -2.5 indicating osteoporosis. ? ?No prior history of fragility fractures or falls. + history of kidney stones. ? ?No history of CKD. Last BUN/Cr: 21/0.7 ? ?she is not on HCTZ or other thiazide therapy.  + history of vitamin D deficiency. Last  vitamin D level was 38 in 05/02/20 (on replacement therapy). ? ?she is not currently on calcium supplements (had recently been started on supplement by her PCP after her Dexa scan results showed osteopenia as she did not want to start Prolia at that time, however she was told to stop the supplementation at our initial visit). she eats dairy and green, leafy, vegetables on average amounts.  She does not eat meat, therefore the majority of her meals consist of various salads. ? ?she does not have a family history of pituitary tumors, thyroid cancer, or osteoporosis. She had parathyroidectomy on 09/05/20. ? ?I reviewed her chart and she also has a history of HLD.  ? ?Review of systems ? ?Constitutional: + stable body weight,  current Body mass index is 32.39 kg/m?. , + fatigue, no subjective hyperthermia, no subjective hypothermia ?Eyes: no blurry vision, no xerophthalmia ?ENT: no sore throat, no nodules palpated in throat, no dysphagia/odynophagia, no hoarseness ?Cardiovascular: no chest pain, no shortness of breath, no palpitations, no leg swelling ?Respiratory: no cough, no shortness of breath ?Gastrointestinal: no nausea/vomiting, + diarrhea- sees GI ?Musculoskeletal: + back and hip pain ?Skin: no rashes, no hyperemia ?Neurological: no tremors, no numbness, no tingling, no dizziness ?Psychiatric: no depression, no anxiety ? ?----------------------------------------------------------------------------------------------------------------------------- ?OBJECTIVE: ? ?Ht '5\' 8"'$  (1.727 m)   Wt 213 lb (96.6 kg)   BMI 32.39 kg/m? , Body mass index is 32.39 kg/m?. ?Wt Readings from Last 3 Encounters:  ?05/25/21 213 lb (96.6 kg)  ?03/27/21 219 lb 3.2 oz (99.4 kg)  ?09/16/20 215 lb (97.5 kg)  ?  ?BP Readings from Last 3 Encounters:  ?03/27/21 102/70  ?09/16/20 132/80  ?09/05/20 (!) 152/96  ? ? ?Physical Exam- Telehealth- significantly limited due to nature of visit ? ?Constitutional: Body mass index is 32.39 kg/m?. , not in  acute distress, normal state of mind ?Respiratory: Adequate breathing efforts ? ? ?CMP ( most recent) ?CMP  ?   ?Component Value Date/Time  ? BUN 21 05/04/2020 0000  ? CREATININE 0.7 05/04/2020 0000  ? CALCIUM 9.5 03/20/2021 1029  ? GFRNONAA 83 05/04/2020 0000  ? ? ? ?Diabetic Labs (most recent): ?No results found for: HGBA1C ? ? Lipid Panel ( most recent) ?Lipid Panel  ?   ?Component Value Date/Time  ? CHOL 214 (A) 05/02/2020 0000  ? [place] 125 05/02/2020 0000  ? ?  ? ?Lab Results  ?Component Value Date  ? TSH 1.020 04/05/2021  ? TSH 1.320 03/20/2021  ? TSH 0.844 09/13/2020  ? FREET4 1.45 04/05/2021  ? FREET4 1.28 09/13/2020  ?  ?Thyroid US from 08/03/20 ?CLINICAL DATA:  Other.  Hyperparathyroidism. ?  ?EXAM: ?THYROID ULTRASOUND ?  ?TECHNIQUE: ?Ultrasound examination of  the thyroid gland and adjacent soft ?tissues was performed. ?  ?COMPARISON:  None. ?  ?FINDINGS: ?Parenchymal Echotexture: Mildly heterogenous ?  ?Isthmus: 0.1 cm ?  ?Right lobe: 4.2 x 1.9 x 1.7 cm ?  ?Left lobe: 4.3 x 1.5 x 1.7 cm ?  ?_________________________________________________________ ?  ?Estimated total number of nodules >/= 1 cm: 3 ?  ?Number of spongiform nodules >/=  2 cm not described below (TR1): 0 ?  ?Number of mixed cystic and solid nodules >/= 1.5 cm not described ?below ([place]): 0 ?  ?_________________________________________________________ ?  ?Nodule # 1: ?  ?Location: Right; Mid ?  ?Maximum size: 1.0 cm; Other 2 dimensions: 0.6 x 0.5 cm ?  ?Composition: solid/almost completely solid (2) ?  ?Echogenicity: hypoechoic (2) ?  ?Shape: not taller-than-wide (0) ?  ?Margins: smooth (0) ?  ?Echogenic foci: none (0) ?  ?ACR TI-RADS total points: 4. ?  ?ACR TI-RADS risk category: TR4 (4-6 points). ?  ?ACR TI-RADS recommendations: ?  ?*Given size (>/= 1 - 1.4 cm) and appearance, a follow-up ultrasound ?in 1 year should be considered based on TI-RADS criteria. ?  ?_________________________________________________________ ?  ?Nodule # 2: ?   ?Location: Left; [place] ?  ?Maximum size: 1.3 cm; Other 2 dimensions: 1.0 x 0.8 cm ?  ?Composition: solid/almost completely solid (2) ?  ?Echogenicity: hypoechoic (2) ?  ?Shape: not taller-than-wide (0) ?  ?M

## 2022-04-30 ENCOUNTER — Ambulatory Visit (HOSPITAL_COMMUNITY)
Admission: RE | Admit: 2022-04-30 | Discharge: 2022-04-30 | Disposition: A | Discharge status: Home / Self Care | Payer: BC Managed Care – PPO | Source: Ambulatory Visit | Attending: Nurse Practitioner | Admitting: Nurse Practitioner

## 2022-04-30 DIAGNOSIS — E042 Nontoxic multinodular goiter: Secondary | ICD-10-CM | POA: Diagnosis not present

## 2022-05-28 ENCOUNTER — Encounter: Payer: Self-pay | Admitting: Nurse Practitioner

## 2022-05-28 ENCOUNTER — Ambulatory Visit: Payer: BC Managed Care – PPO | Admitting: Nurse Practitioner

## 2022-05-28 VITALS — BP 130/86 | HR 76 | Ht 68.0 in | Wt 221.0 lb

## 2022-05-28 DIAGNOSIS — E210 Primary hyperparathyroidism: Secondary | ICD-10-CM

## 2022-05-28 DIAGNOSIS — E042 Nontoxic multinodular goiter: Secondary | ICD-10-CM

## 2022-05-28 DIAGNOSIS — E892 Postprocedural hypoparathyroidism: Secondary | ICD-10-CM

## 2022-05-28 NOTE — Progress Notes (Signed)
Endocrinology Follow Up Note        05/28/2022, 9:50 AM    Loretta Young is a 62 y.o.-year-old female, referred by her  Gardiner Rhyme, MD  , for evaluation for hypercalcemia/hyperparathyroidism and MNG.   Past Medical History:  Diagnosis Date   Young ([place])    Hyperlipidemia     Past Surgical History:  Procedure Laterality Date   ABDOMINAL HYSTERECTOMY     CESAREAN SECTION     HIATAL HERNIA REPAIR     l4-l5 fusion     PARATHYROIDECTOMY Left 09/05/2020   Procedure: LEFT INFERIOR PARATHYROIDECTOMY;  Surgeon: Armandina Gemma, MD;  Location: [facility];  Service: General;  Laterality: Left;   SPINAL FUSION      Social History   Tobacco Use   Smoking status: Former    Packs/day: 0.50    Years: 10.00    Additional pack years: 0.00    Total pack years: 5.00    Types: Cigarettes   Smokeless tobacco: Never   Tobacco comments:    Quit 35 years ago  Vaping Use   Vaping Use: Never used  Substance Use Topics   Alcohol use: Not Currently   Drug use: Never    History reviewed. No pertinent family history.  Outpatient Encounter Medications as of 05/28/2022  Medication Sig   calcium carbonate (TUMS EX) 750 MG chewable tablet Chew by mouth.   CALCIUM-VITAMIN D PO Take by mouth. Takes 2 chews daily.   Cholecalciferol 25 MCG (1000 UT) capsule Take 2,000 Units by mouth.   Docusate Calcium (STOOL SOFTENER PO) Take by mouth. Patient reports that she takes daily   ezetimibe (ZETIA) 10 MG tablet Take 10 mg by mouth at bedtime.   hyoscyamine (LEVSIN SL) 0.125 MG SL tablet SMARTSIG:2 Sublingual Twice Daily   losartan (COZAAR) 50 MG tablet Take by mouth.   NON FORMULARY Place 1 application vaginally daily. Cs-e2 0.2/te 1 Nat Whip vaginal cream   Omega-3 Fatty Acids (FISH OIL) 1000 MG CAPS Take 1,000 mg by mouth daily.   omeprazole (PRILOSEC) 40 MG capsule Take 40 mg by mouth daily with breakfast.   Semaglutide, 1 MG/DOSE, (OZEMPIC, 1  MG/DOSE,) 4 MG/3ML SOPN Inject 4 mg into the skin once a week.   CREON 36000-114000 units CPEP capsule Take by mouth.   fluticasone (FLOVENT HFA) 220 MCG/ACT inhaler Inhale into the lungs.   No facility-administered encounter medications on file as of 05/28/2022.    Allergies  Allergen Reactions   Mirabegron Anaphylaxis    Imported allergy: Patient doesn't recall   Amlodipine     Other reaction(s): Headache   Oxycodone-Acetaminophen     Other reaction(s): gi distress NAUSEA   Nirmatrelvir-Ritonavir Nausea And Vomiting    Paxlovid   Oxycodone Nausea And Vomiting    Percodan   Penicillins Hives   Sulfa Antibiotics Hives   Sulfamethoxazole-Trimethoprim     Other reaction(s): Unknown     HPI  Loretta Young was diagnosed with hypercalcemia in February, 2022.  Patient has no previously known history of parathyroid, pituitary, adrenal dysfunctions; + family history of hyperparathyroidism in her mother.  We discussed pt's DEXA scans: no report available to review but patient reports she  is osteopenic, borderline osteoporotic.  Report from [facility] shows T-score of -2.5 indicating osteoporosis.  No prior history of fragility fractures or falls. + history of kidney stones.  No history of CKD. Last BUN/Cr: 21/0.7  she is not on HCTZ or other thiazide therapy.  + history of vitamin D deficiency. Last vitamin D level was 38 in 05/02/20 (on replacement therapy).  she is not currently on calcium supplements (had recently been started on supplement by her PCP after her Dexa scan results showed osteopenia as she did not want to start Prolia at that time, however she was told to stop the supplementation at our initial visit). she eats dairy and green, leafy, vegetables on average amounts.  She does not eat meat, therefore the majority of her meals consist of various salads.  she does not have a family history of pituitary tumors, thyroid Young, or osteoporosis. She had  parathyroidectomy on 09/05/20.  I reviewed her chart and she also has a history of HLD.   Review of systems  Constitutional: + stable body weight,  current Body mass index is 33.6 kg/m. , + fatigue, no subjective hyperthermia, no subjective hypothermia Eyes: no blurry vision, no xerophthalmia ENT: no sore throat, no nodules palpated in throat, no dysphagia/odynophagia, no hoarseness Cardiovascular: no chest pain, no shortness of breath, no palpitations, no leg swelling Respiratory: no cough, no shortness of breath Gastrointestinal: no nausea/vomiting, + diarrhea- sees GI Musculoskeletal: + back and hip pain Skin: no rashes, no hyperemia Neurological: no tremors, no numbness, no tingling, no dizziness Psychiatric: no depression, no anxiety  ----------------------------------------------------------------------------------------------------------------------------- OBJECTIVE:  BP 130/86 (BP Location: Left Arm, Patient Position: Sitting, Cuff Size: Large)   Pulse 76   Ht 5\' 8"  (1.727 m)   Wt 221 lb (100.2 kg)   BMI 33.60 kg/m , Body mass index is 33.6 kg/m. Wt Readings from Last 3 Encounters:  05/28/22 221 lb (100.2 kg)  05/25/21 213 lb (96.6 kg)  03/27/21 219 lb 3.2 oz (99.4 kg)    BP Readings from Last 3 Encounters:  05/28/22 130/86  03/27/21 102/70  09/16/20 132/80     Physical Exam- Limited  Constitutional:  Body mass index is 33.6 kg/m. , not in acute distress, normal state of mind Eyes:  EOMI, no exophthalmos Musculoskeletal: no gross deformities, strength intact in all four extremities, no gross restriction of joint movements Skin:  no rashes, no hyperemia Neurological: no tremor with outstretched hands   CMP ( most recent) CMP     Component Value Date/Time   BUN 21 05/04/2020 0000   CREATININE 0.7 05/04/2020 0000   CALCIUM 9.5 03/20/2021 1029   GFRNONAA 83 05/04/2020 0000     Diabetic Labs (most recent): No results found for: "HGBA1C",  "MICROALBUR"   Lipid Panel ( most recent) Lipid Panel     Component Value Date/Time   CHOL 214 (A) 05/02/2020 0000   LDLCALC 125 05/02/2020 0000      Lab Results  Component Value Date   TSH 1.020 04/05/2021   TSH 1.320 03/20/2021   TSH 0.844 09/13/2020   FREET4 1.45 04/05/2021   FREET4 1.28 09/13/2020    Thyroid US from 08/03/20 CLINICAL DATA:  Other.  Hyperparathyroidism.   EXAM: THYROID ULTRASOUND   TECHNIQUE: Ultrasound examination of the thyroid gland and adjacent soft tissues was performed.   COMPARISON:  None.   FINDINGS: Parenchymal Echotexture: Mildly heterogenous   Isthmus: 0.1 cm   Right lobe: 4.2 x 1.9 x 1.7 cm  Left lobe: 4.3 x 1.5 x 1.7 cm   _________________________________________________________   Estimated total number of nodules >/= 1 cm: 3   Number of spongiform nodules >/=  2 cm not described below (TR1): 0   Number of mixed cystic and solid nodules >/= 1.5 cm not described below ([place]): 0   _________________________________________________________   Nodule # 1:   Location: Right; Mid   Maximum size: 1.0 cm; Other 2 dimensions: 0.6 x 0.5 cm   Composition: solid/almost completely solid (2)   Echogenicity: hypoechoic (2)   Shape: not taller-than-wide (0)   Margins: smooth (0)   Echogenic foci: none (0)   ACR TI-RADS total points: 4.   ACR TI-RADS risk category: TR4 (4-6 points).   ACR TI-RADS recommendations:   *Given size (>/= 1 - 1.4 cm) and appearance, a follow-up ultrasound in 1 year should be considered based on TI-RADS criteria.   _________________________________________________________   Nodule # 2:   Location: Left; Superior   Maximum size: 1.3 cm; Other 2 dimensions: 1.0 x 0.8 cm   Composition: solid/almost completely solid (2)   Echogenicity: hypoechoic (2)   Shape: not taller-than-wide (0)   Margins: smooth (0)   Echogenic foci: none (0) - a few small linear echogenic foci scattered throughout  the nodule are favored to represent the posterior wall of internal microcystic components rather than true punctate echogenic foci.   ACR TI-RADS total points: 4.   ACR TI-RADS risk category: TR4 (4-6 points).   ACR TI-RADS recommendations:   *Given size (>/= 1 - 1.4 cm) and appearance, a follow-up ultrasound in 1 year should be considered based on TI-RADS criteria.   _________________________________________________________   Nodule # 3:   Location: Left; Inferior   Maximum size: 2.4 cm; Other 2 dimensions: 1.5 x 1.1 cm   Composition: solid/almost completely solid (2)   Echogenicity: hypoechoic (2)   Shape: not taller-than-wide (0)   Margins: smooth (0)   Echogenic foci: none (0)   ACR TI-RADS total points: 4.   ACR TI-RADS risk category: TR4 (4-6 points).   ACR TI-RADS recommendations:   *Given size (>/= 1 - 1.4 cm) and appearance, a follow-up ultrasound in 1 year should be considered based on TI-RADS criteria.   _________________________________________________________   IMPRESSION: 1. Three thyroid nodules are identified as detailed above. Each of the 3 nodules meet criteria for imaging surveillance. Recommend follow-up ultrasound in 1 year.   The above is in keeping with the ACR TI-RADS recommendations - J Am Coll Radiol 2017;14:587-595.     Electronically Signed   By: Jacqulynn Cadet M.D.   On: 08/03/2020 11:26    Thyroid US from 04/05/21 CLINICAL DATA:  Prior ultrasound follow-up.   EXAM: THYROID ULTRASOUND   TECHNIQUE: Ultrasound examination of the thyroid gland and adjacent soft tissues was performed.   COMPARISON:  08/03/2020   FINDINGS: Parenchymal Echotexture: Normal   Isthmus: 0.1 cm, previously 0.1 cm   Right lobe: 4.8 x 1.4 x 2.2 cm, previously 4.2 x 1.9 x 1.7 cm   Left lobe: 4.5 x 1.4 x 1.9 cm, previously 4.3 x 1.5 x 1.7 cm   _________________________________________________________   Estimated total number of nodules  >/= 1 cm: 2   Number of spongiform nodules >/=  2 cm not described below (TR1): 0   Number of mixed cystic and solid nodules >/= 1.5 cm not described below ([place]): 0   _________________________________________________________   Nodule # 1:   Prior biopsy: No   Location: Right;  Superior   Maximum size: 1.0 cm; Other 2 dimensions: 0.8 x 0.5 cm, previously, 1.0 x 0.6 x 0.5 cm   Composition: solid/almost completely solid (2)   Echogenicity: hypoechoic (2)   Shape: not taller-than-wide (0)   Margins: smooth (0)   Echogenic foci: none (0)   ACR TI-RADS total points: 4.   ACR TI-RADS risk category:  TR4 (4-6 points).   Significant change in size (>/= 20% in two dimensions and minimal increase of 2 mm): No   Change in features: No   Change in ACR TI-RADS risk category: No   ACR TI-RADS recommendations:   *Given size (>/= 1 - 1.4 cm) and appearance, a follow-up ultrasound in 1 year should be considered based on TI-RADS criteria.   _________________________________________________________   Nodule # 2:   Prior biopsy: No   Location: Left; Superior   Maximum size: 1.2 cm; Other 2 dimensions: 0.7 x 1.0 cm, previously, 1.3 x 1.0 x 0.8 cm   Composition: spongiform (0)   Echogenicity: hypoechoic (2)   Shape: not taller-than-wide (0)   Margins: smooth (0)   Echogenic foci: none (0)   ACR TI-RADS total points: 2.   ACR TI-RADS risk category:  TR2 (2 points).   Significant change in size (>/= 20% in two dimensions and minimal increase of 2 mm): No   Change in features: Yes   Change in ACR TI-RADS risk category: Yes   ACR TI-RADS recommendations:   This nodule does NOT meet TI-RADS criteria for biopsy or dedicated follow-up.   _________________________________________________________   Previously visualized left inferior hypoechoic nodule is no longer present. No new suspicious pulmonary nodules. No cervical lymphadenopathy.   IMPRESSION: 1. Similar  appearance of right superior solid thyroid nodule (labeled 1, 1.2 cm) which again meets criteria (TI-RADS category 4) for 1 year ultrasound surveillance. This study marks 1 year stability. 2. 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 3. Previous visualized left superior thyroid nodule now demonstrates spongiform sonographic characteristics in therefore now classified as benign, not warranting additional ultrasound follow-up or tissue sampling.   The above is in keeping with the ACR TI-RADS recommendations - J Am Coll Radiol 2017;14:587-595.   Loretta Cancer, MD   [facility]   [facility]     Electronically Signed   By: Loretta Young M.D.   On: 04/05/2021 12:13 ---------------------------------------------------------------------------------------------------------------------- Thyroid US from 04/30/22 CLINICAL DATA:  Prior ultrasound follow-up.   EXAM: THYROID ULTRASOUND   TECHNIQUE: Ultrasound examination of the thyroid gland and adjacent soft tissues was performed.   COMPARISON:  04/05/2021 and 08/03/2020   FINDINGS: Parenchymal Echotexture: Normal   Isthmus: 0.1 cm   Right lobe: 5.2 x 1.1 x 1.9 cm   Left lobe: 5.1 x 1.3 x 1.9 cm   _________________________________________________________   Estimated total number of nodules >/= 1 cm: 2   Number of spongiform nodules >/=  2 cm not described below (TR1): 1   Number of mixed cystic and solid nodules >/= 1.5 cm not described below ([place]): 1   _________________________________________________________   Nodule # 1:   Prior biopsy: No   Location: Right; Mid   Maximum size: 1.0 cm; Other 2 dimensions: 0.6 x 0.5 cm, previously, 1.0 cm   Composition: solid/almost completely solid  (2).  Nearly spongiform.   Echogenicity: hypoechoic (2)   Shape: not taller-than-wide (0)  Margins: smooth (0)   Echogenic foci: none (0)   ACR TI-RADS total points: 4.   ACR TI-RADS risk category:  TR4 (4-6 points).   Significant change in size (>/= 20% in two dimensions and minimal increase of 2 mm): No   Change in features: No   Change in ACR TI-RADS risk category: No   ACR TI-RADS recommendations:   *Given size (>/= 1 - 1.4 cm) and appearance, a follow-up ultrasound in 1 year should be considered based on TI-RADS criteria. This nodule has now been stable for almost 2 years.   _________________________________________________________   A spongiform nodule in the superior left lobe measures 1.3 cm in greatest dimensions, is stable and continues to not meet criteria for dedicated follow-up.   IMPRESSION: Stable 1 cm right mid thyroid nodule. Recommend additional 1 year follow-up ultrasound. If the nodule is stable at that time for a 3 year follow-up, an additional 5 year follow-up ultrasound can be performed in 2027 and if that is stable then follow-up can be discontinued.   The above is in keeping with the ACR TI-RADS recommendations - J Am Coll Radiol 2017;14:587-595.     Electronically Signed   By: Aletta Edouard M.D.   On: 04/30/2022 15:43  Latest Reference Range & Units 09/13/20 08:05 03/20/21 10:29 04/05/21 08:09  TSH 0.450 - 4.500 uIU/mL 0.844 1.320 1.020  Triiodothyronine,Free,Serum 2.0 - 4.4 pg/mL   3.3  T4,Free(Direct) 0.82 - 1.77 ng/dL 1.28  1.45  Thyroperoxidase Ab SerPl-aCnc 0 - 34 IU/mL   <9  Thyroglobulin Antibody 0.0 - 0.9 IU/mL   <1.0   ------------------------------------------------------------------------------------------------------------------------------ Assessment / PLAN:  1. Primary hyperparathyroidism s/p parathyroidectomy on 09/05/20- RESOLVED  Patient had several instances of elevated calcium, with the highest level being at  11.1 mg/dL. A corresponding intact PTH level was also high, at 142.   - She did have apparent complications from hypercalcemia/hyperparathyroidism with nephrolithiasis and osteopenia.    -She was referred to Dr. Armandina Gemma with [facility] for parathyroidectomy of the overactive gland which she underwent surgery on 09/05/20.  Since surgery, her labs have returned to normal.  2. Multinodular Thyroid  She had thyroid ultrasound 04/30/22 which showed stable right superior thyroid nodule, still recommending annual surveillance until 5 years stability reached (around Feb 2026).  She is retiring early in May this year and will have high cost insurance until her Medicare plan goes into play 2 years from now.  Given her stability and presentation, I do believe it is safe to wait that time before repeating ultrasound to help conserve cost.  Will wait til closer time to order the ultrasound and bloodwork.     I spent  16  minutes in the care of the patient today including review of labs from Thyroid Function, CMP, and other relevant labs ; imaging/biopsy records (current and previous including abstractions from other facilities); face-to-face time discussing  her lab results and symptoms, medications doses, her options of short and long term treatment based on the latest standards of care / guidelines;   and documenting the encounter.  Loretta Young  participated in the discussions, expressed understanding, and voiced agreement with the above plans.  All questions were answered to her satisfaction. she is encouraged to contact clinic should she have any questions or concerns prior to her return visit.   Follow Up PLAN: - Return in about 2 years (around 05/27/2024) for Thyroid follow up, Previsit labs, thyroid ultrasound.   Rayetta Pigg, FNP-BC [facility] [address] [place], [place] [address] Phone: [phone] Fax: [phone]    05/28/2022, 9:50  AM

## 2022-07-16 IMAGING — US US THYROID
1 series · 13 of 25 positions shown · non-contrast
Comparison: None.

CLINICAL DATA: Other.  Hyperparathyroidism.

EXAM:
THYROID ULTRASOUND
TECHNIQUE: Ultrasound examination of the thyroid gland and adjacent soft
tissues was performed.

[Series 1: us thyroid · 0.06mm/px · 13 of 72 slices shown]
[im 1/72]
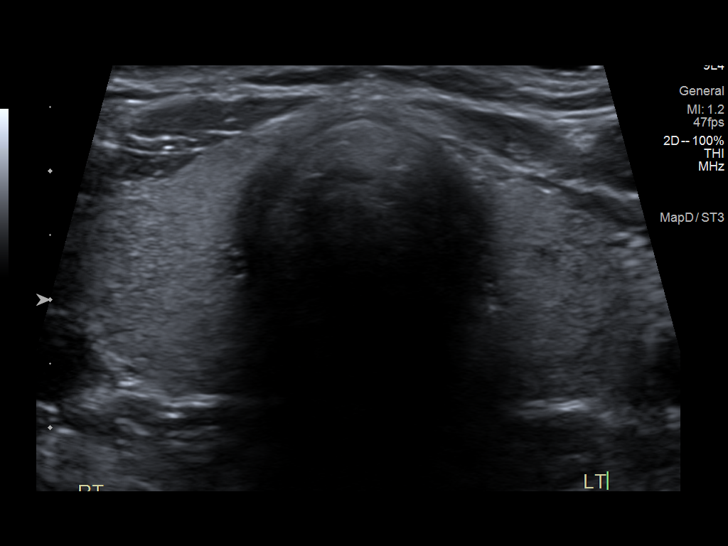
[im 6/72]
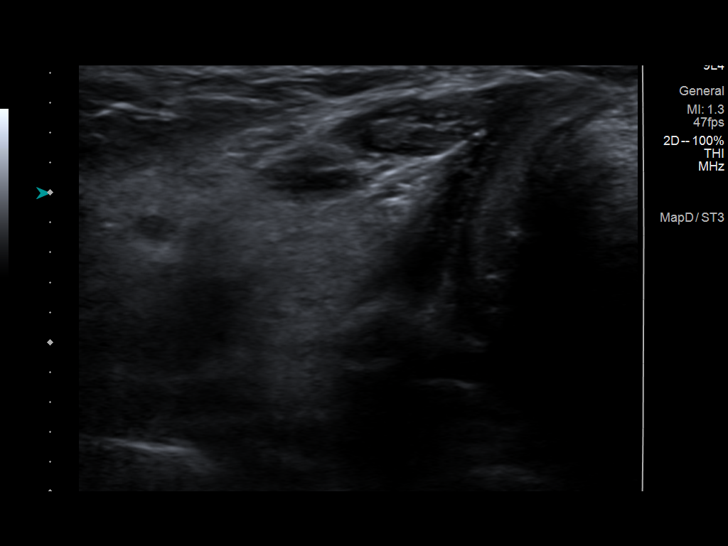
[im 12/72]
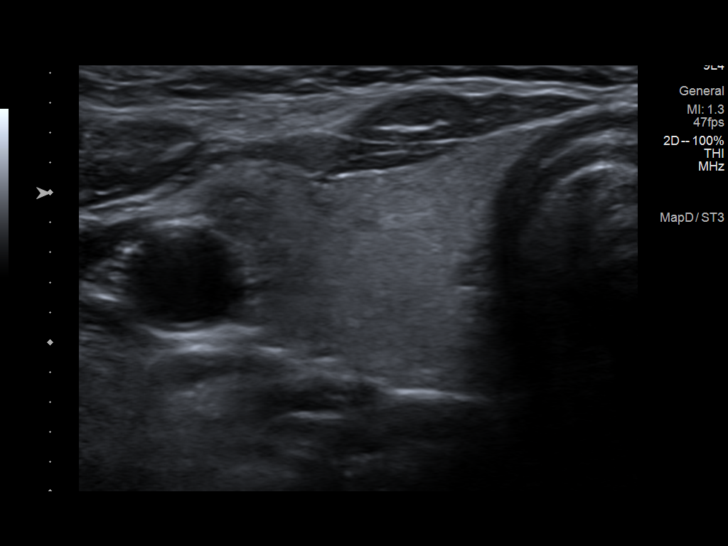
[im 18/72]
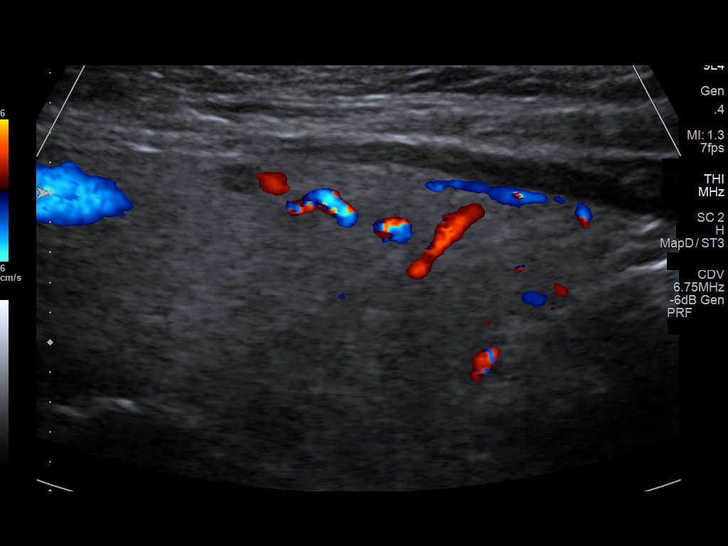
[im 24/72]
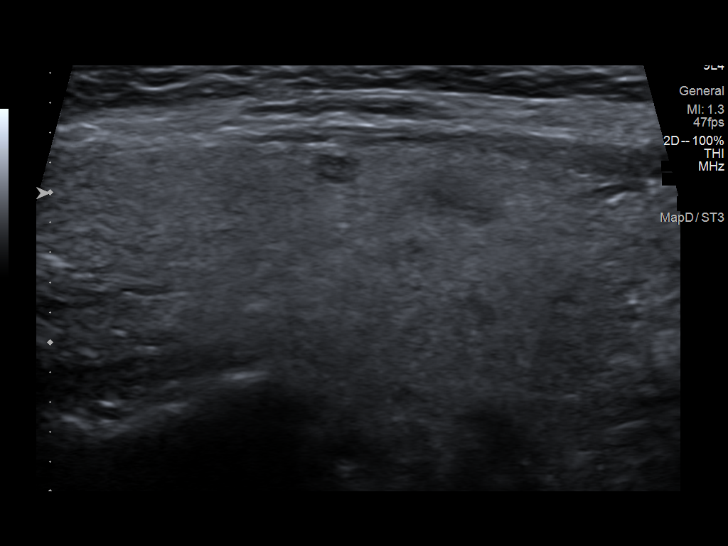
[im 30/72]
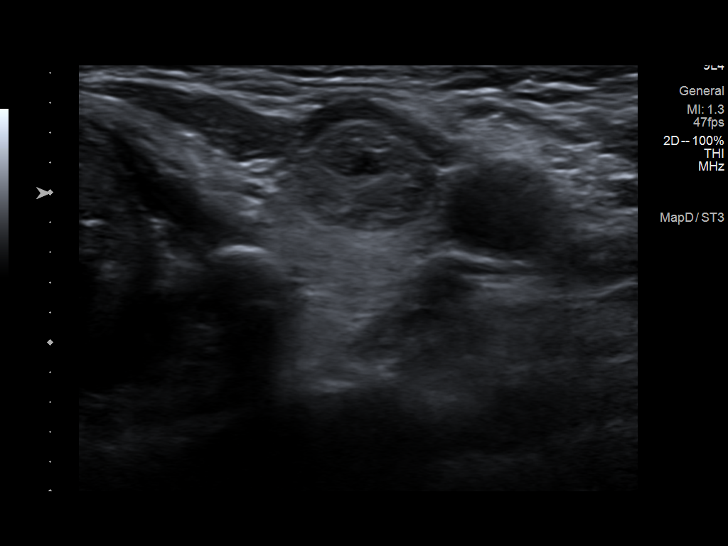
[im 36/72]
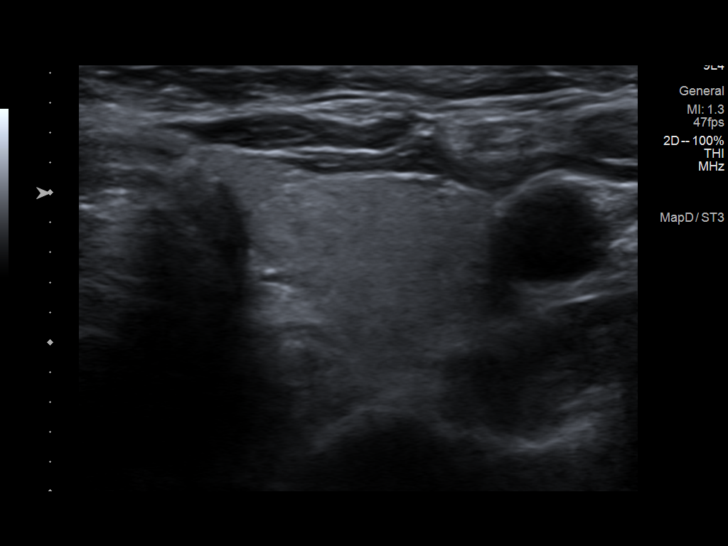
[im 42/72]
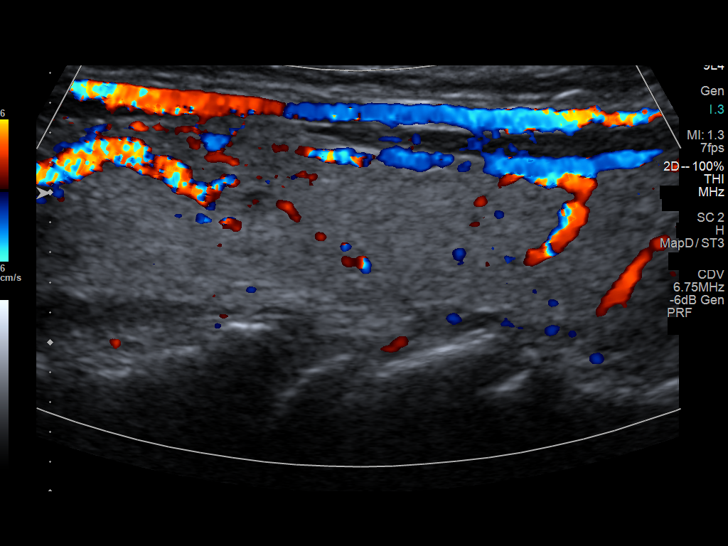
[im 48/72]
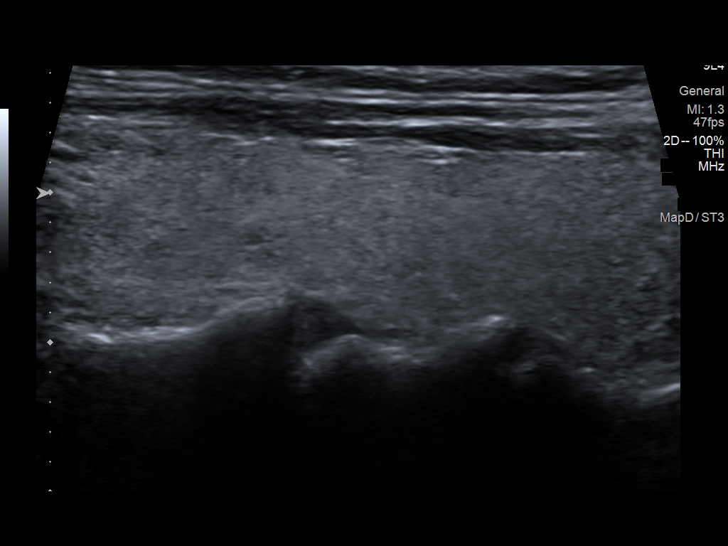
[im 54/72]
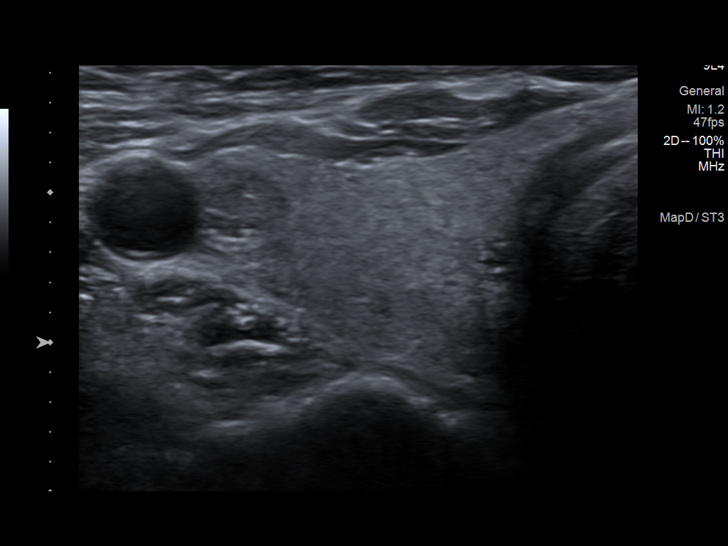
[im 60/72]
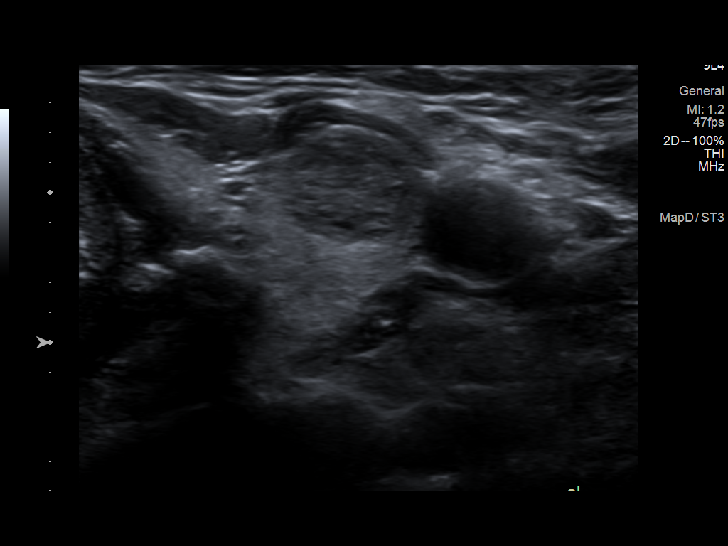
[im 66/72]
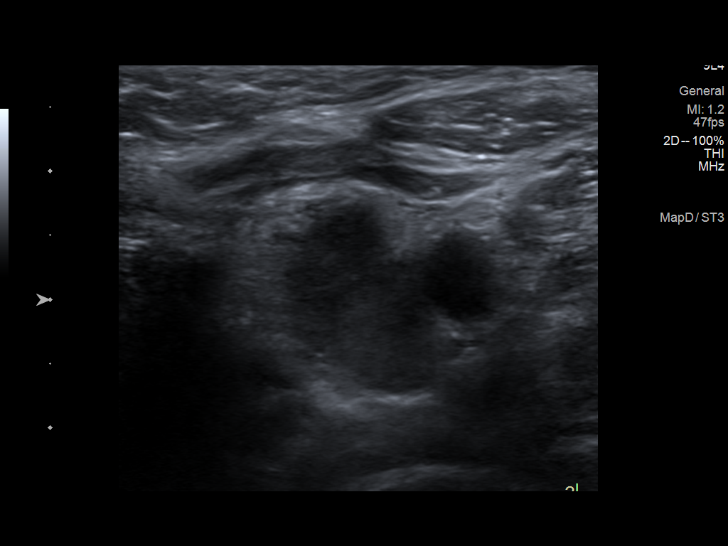
[im 72/72]
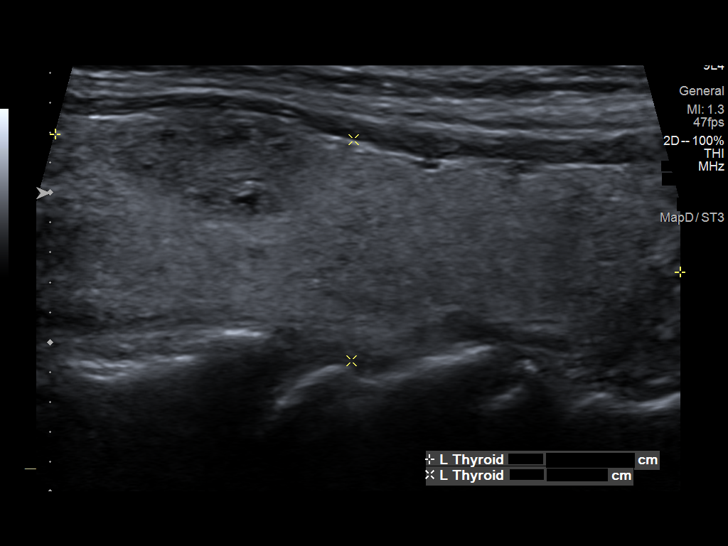

[13 of 25 positions shown; findings below may reference images not displayed]

FINDINGS: Parenchymal Echotexture: Mildly heterogenous

Isthmus: 0.1 cm

Right lobe: 4.2 x 1.9 x 1.7 cm

Left lobe: 4.3 x 1.5 x 1.7 cm

_________________________________________________________

Estimated total number of nodules >/= 1 cm: 3

Number of spongiform nodules >/=  2 cm not described below (TR1): 0

Number of mixed cystic and solid nodules >/= 1.5 cm not described
below (TR2): 0

_________________________________________________________

Nodule # 1:

Location: Right; Mid

Maximum size: 1.0 cm; Other 2 dimensions: 0.6 x 0.5 cm

Composition: solid/almost completely solid (2)

Echogenicity: hypoechoic (2)

Shape: not taller-than-wide (0)

Margins: smooth (0)

Echogenic foci: none (0)

ACR TI-RADS total points: 4.

ACR TI-RADS risk category: TR4 (4-6 points).

ACR TI-RADS recommendations:

*Given size (>/= 1 - 1.4 cm) and appearance, a follow-up ultrasound
in 1 year should be considered based on TI-RADS criteria.

_________________________________________________________

Nodule # 2:

Location: Left; Superior

Maximum size: 1.3 cm; Other 2 dimensions: 1.0 x 0.8 cm

Composition: solid/almost completely solid (2)

Echogenicity: hypoechoic (2)

Shape: not taller-than-wide (0)

Margins: smooth (0)

Echogenic foci: none (0) - a few small linear echogenic foci
scattered throughout the nodule are favored to represent the
posterior wall of internal microcystic components rather than true
punctate echogenic foci.

ACR TI-RADS total points: 4.

ACR TI-RADS risk category: TR4 (4-6 points).

ACR TI-RADS recommendations:

*Given size (>/= 1 - 1.4 cm) and appearance, a follow-up ultrasound
in 1 year should be considered based on TI-RADS criteria.

_________________________________________________________

Nodule # 3:

Location: Left; Inferior

Maximum size: 2.4 cm; Other 2 dimensions: 1.5 x 1.1 cm

Composition: solid/almost completely solid (2)

Echogenicity: hypoechoic (2)

Shape: not taller-than-wide (0)

Margins: smooth (0)

Echogenic foci: none (0)

ACR TI-RADS total points: 4.

ACR TI-RADS risk category: TR4 (4-6 points).

ACR TI-RADS recommendations:

*Given size (>/= 1 - 1.4 cm) and appearance, a follow-up ultrasound
in 1 year should be considered based on TI-RADS criteria.

_________________________________________________________
IMPRESSION: 1. Three thyroid nodules are identified as detailed above. Each of
the 3 nodules meet criteria for imaging surveillance. Recommend
follow-up ultrasound in 1 year.

The above is in keeping with the ACR TI-RADS recommendations - [HOSPITAL] 7374;[DATE].

## 2022-09-24 LAB — EXTERNAL GENERIC LAB PROCEDURE: COLOGUARD: NEGATIVE

## 2023-02-06 ENCOUNTER — Other Ambulatory Visit: Payer: Self-pay | Admitting: Medical Genetics

## 2023-02-13 ENCOUNTER — Other Ambulatory Visit: Payer: Self-pay | Admitting: Medical Genetics

## 2023-02-15 ENCOUNTER — Other Ambulatory Visit (HOSPITAL_COMMUNITY)
Admission: RE | Admit: 2023-02-15 | Discharge: 2023-02-15 | Disposition: A | Discharge status: Home / Self Care | Payer: Self-pay | Source: Ambulatory Visit | Attending: Oncology | Admitting: Oncology

## 2023-02-25 LAB — GENECONNECT MOLECULAR SCREEN: Genetic Analysis Overall Interpretation: NEGATIVE

## 2023-03-18 IMAGING — US US THYROID
1 series · 12 of 25 positions shown · non-contrast
Comparison: 08/03/2020

CLINICAL DATA: Prior ultrasound follow-up.

EXAM:
THYROID ULTRASOUND
TECHNIQUE: Ultrasound examination of the thyroid gland and adjacent soft
tissues was performed.

[Series 1: us thyroid · 12 of 90 slices shown]
[im 4/90]
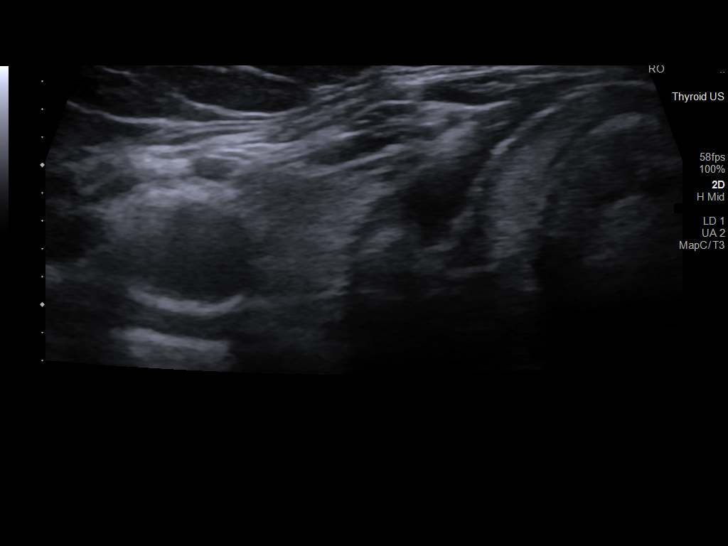
[im 12/90]
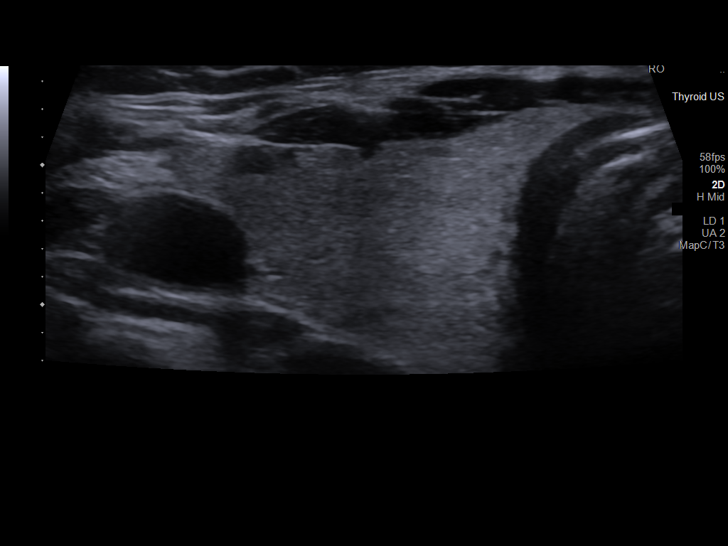
[im 19/90]
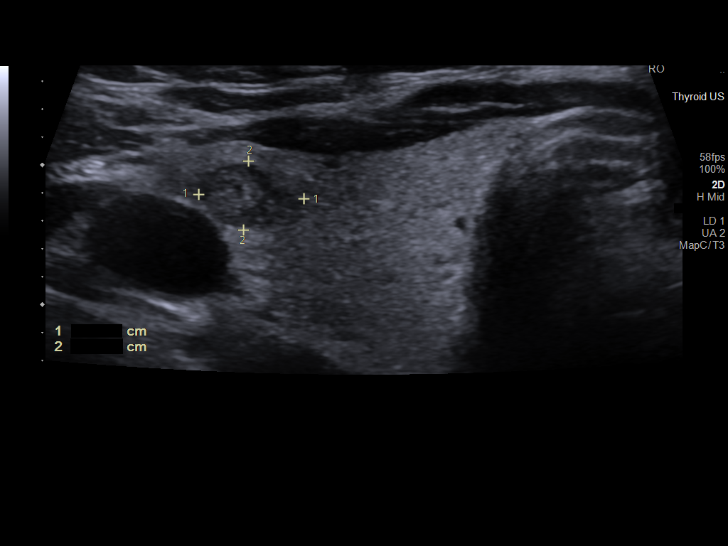
[im 26/90]
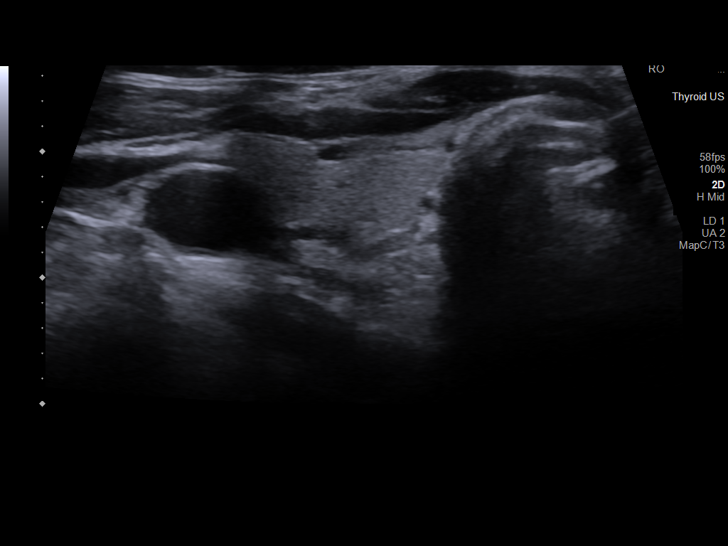
[im 34/90]
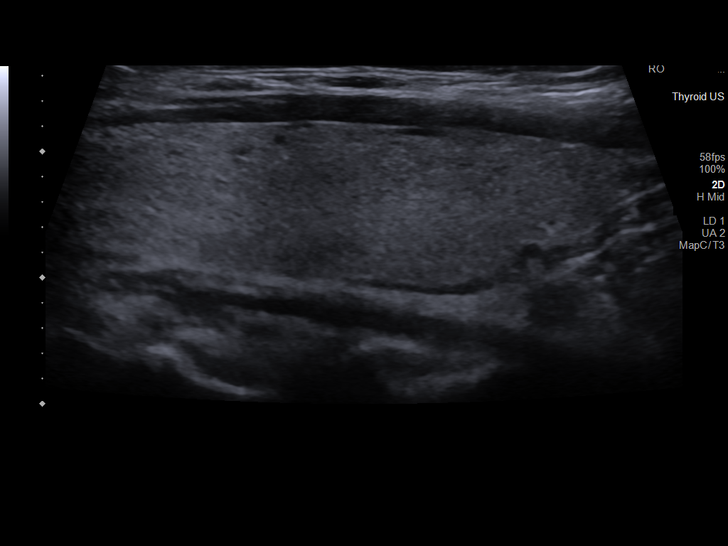
[im 41/90]
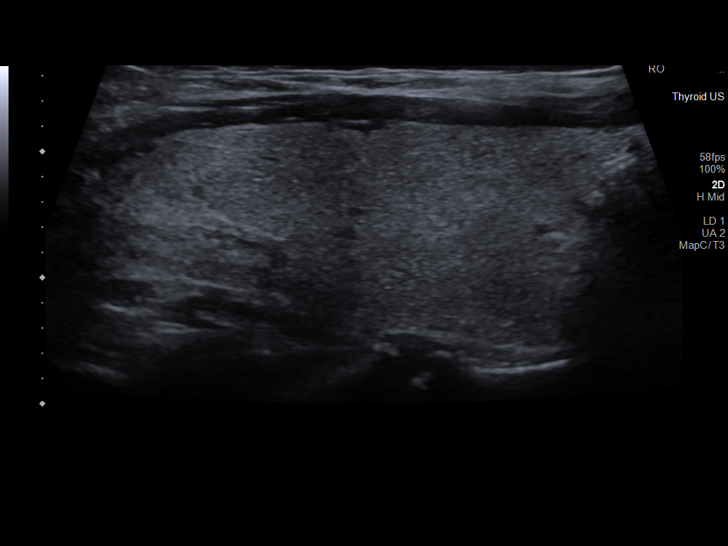
[im 49/90]
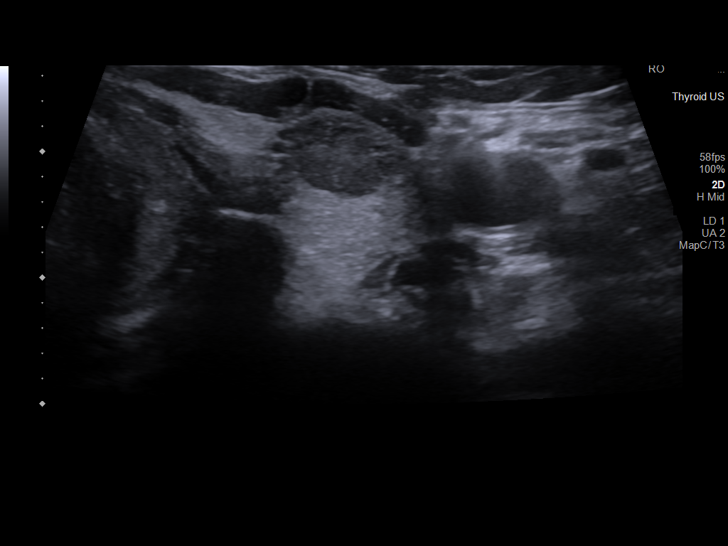
[im 56/90]
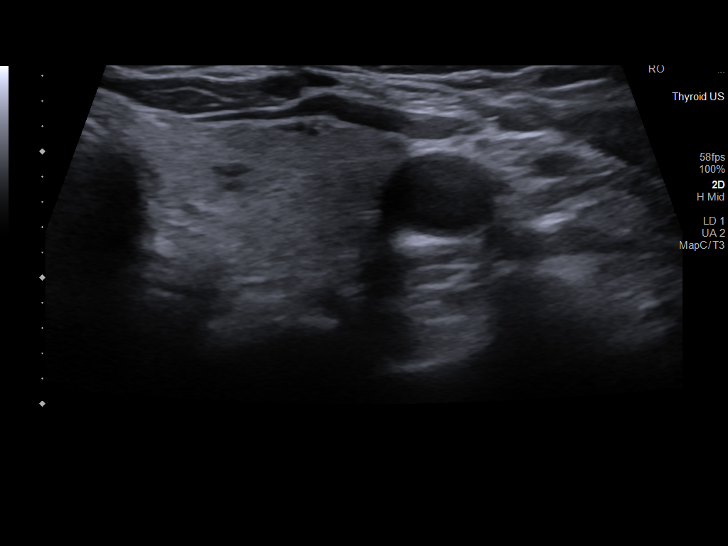
[im 64/90]
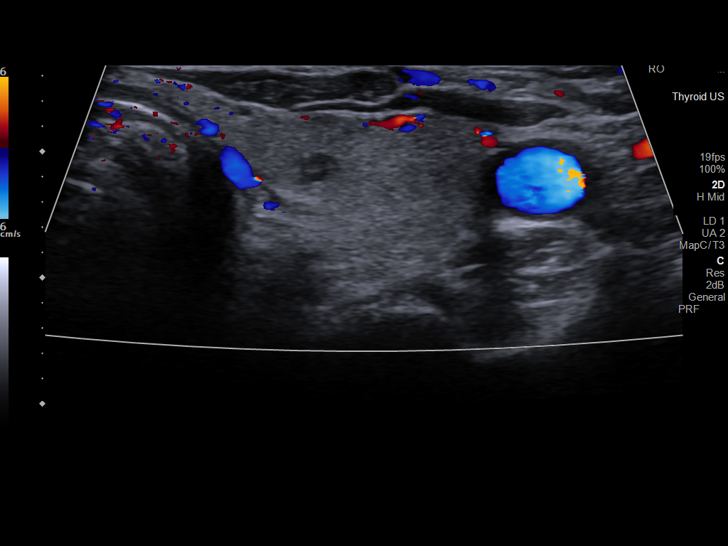
[im 71/90]
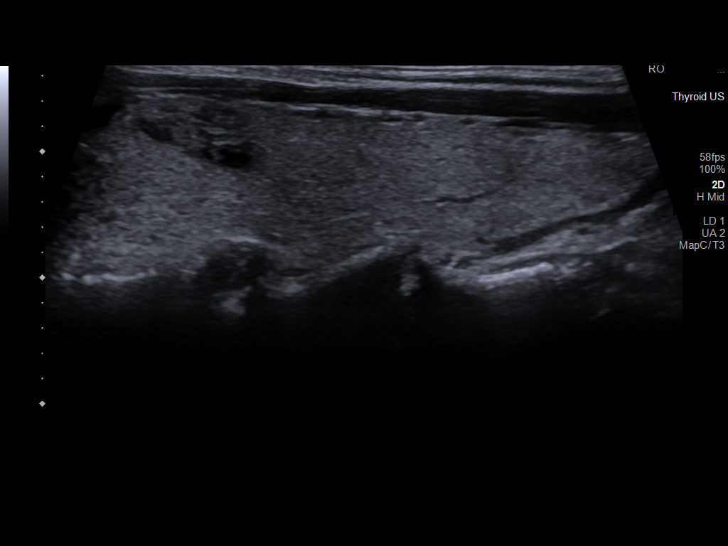
[im 78/90]
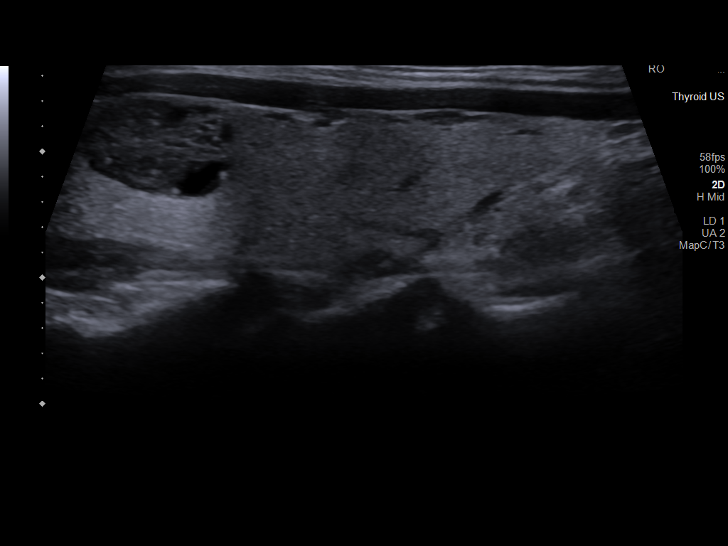
[im 86/90]
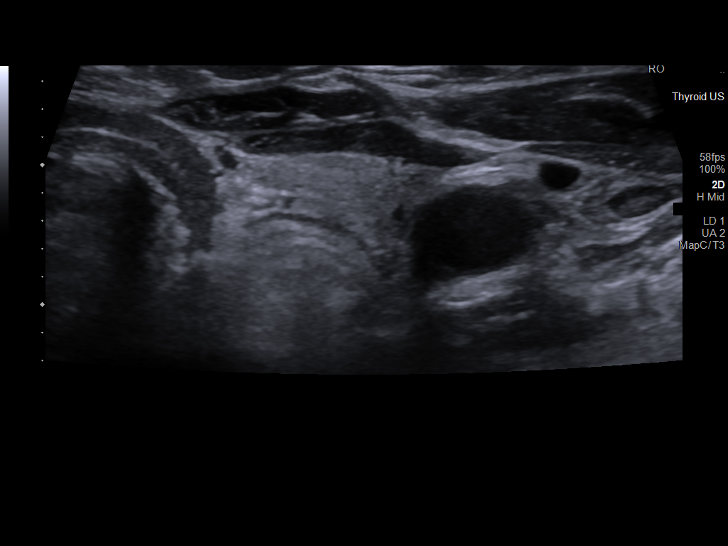

[12 of 25 positions shown; findings below may reference images not displayed]

FINDINGS: Parenchymal Echotexture: Normal

Isthmus: 0.1 cm, previously 0.1 cm

Right lobe: 4.8 x 1.4 x 2.2 cm, previously 4.2 x 1.9 x 1.7 cm

Left lobe: 4.5 x 1.4 x 1.9 cm, previously 4.3 x 1.5 x 1.7 cm

_________________________________________________________

Estimated total number of nodules >/= 1 cm: 2

Number of spongiform nodules >/=  2 cm not described below (TR1): 0

Number of mixed cystic and solid nodules >/= 1.5 cm not described
below (TR2): 0

_________________________________________________________

Nodule # 1:

Prior biopsy: No

Location: Right; Superior

Maximum size: 1.0 cm; Other 2 dimensions: 0.8 x 0.5 cm, previously,
1.0 x 0.6 x 0.5 cm

Composition: solid/almost completely solid (2)

Echogenicity: hypoechoic (2)

Shape: not taller-than-wide (0)

Margins: smooth (0)

Echogenic foci: none (0)

ACR TI-RADS total points: 4.

ACR TI-RADS risk category:  TR4 (4-6 points).

Significant change in size (>/= 20% in two dimensions and minimal
increase of 2 mm): No

Change in features: No

Change in ACR TI-RADS risk category: No

ACR TI-RADS recommendations:

*Given size (>/= 1 - 1.4 cm) and appearance, a follow-up ultrasound
in 1 year should be considered based on TI-RADS criteria.

_________________________________________________________

Nodule # 2:

Prior biopsy: No

Location: Left; Superior

Maximum size: 1.2 cm; Other 2 dimensions: 0.7 x 1.0 cm, previously,
1.3 x 1.0 x 0.8 cm

Composition: spongiform (0)

Echogenicity: hypoechoic (2)

Shape: not taller-than-wide (0)

Margins: smooth (0)

Echogenic foci: none (0)

ACR TI-RADS total points: 2.

ACR TI-RADS risk category:  TR2 (2 points).

Significant change in size (>/= 20% in two dimensions and minimal
increase of 2 mm): No

Change in features: Yes

Change in ACR TI-RADS risk category: Yes

ACR TI-RADS recommendations:

This nodule does NOT meet TI-RADS criteria for biopsy or dedicated
follow-up.

_________________________________________________________

Previously visualized left inferior hypoechoic nodule is no longer
present. No new suspicious pulmonary nodules. No cervical
lymphadenopathy.
IMPRESSION: 1. Similar appearance of right superior solid thyroid nodule
(labeled 1, 1.2 cm) which again meets criteria (TI-RADS category 4)
for 1 year ultrasound surveillance. This study marks 1 year
stability.
2. Previously visualized left inferior hypoechoic solid thyroid
nodule is no longer present, likely representing left inferior
parathyroid adenoma visualized on comparison nuclear medicine study
(now status post parathyroidectomy).
3. Previous visualized left superior thyroid nodule now demonstrates
spongiform sonographic characteristics in therefore now classified
as benign, not warranting additional ultrasound follow-up or tissue
sampling.

The above is in keeping with the ACR TI-RADS recommendations - [HOSPITAL] 3347;[DATE].
